# Patient Record
Sex: Male | Born: 1935 | Race: White | Hispanic: No | Smoking: Never smoker
Health system: Southern US, Community
[De-identification: ages and names within clinical notes are randomized; demographics above are authoritative.]

## PROBLEM LIST (undated history)

## (undated) DIAGNOSIS — I441 Atrioventricular block, second degree: Secondary | ICD-10-CM

## (undated) DIAGNOSIS — I10 Essential (primary) hypertension: Secondary | ICD-10-CM

## (undated) DIAGNOSIS — I350 Nonrheumatic aortic (valve) stenosis: Secondary | ICD-10-CM

## (undated) DIAGNOSIS — K222 Esophageal obstruction: Secondary | ICD-10-CM

## (undated) HISTORY — DX: Nonrheumatic aortic (valve) stenosis: I35.0

## (undated) HISTORY — PX: APPENDECTOMY: SHX54

## (undated) HISTORY — PX: TONSILLECTOMY: SHX5217

## (undated) HISTORY — PX: PACEMAKER IMPLANT: EP1218

## (undated) HISTORY — DX: Esophageal obstruction: K22.2

## (undated) HISTORY — DX: Atrioventricular block, second degree: I44.1

---

## 2000-09-12 ENCOUNTER — Inpatient Hospital Stay (HOSPITAL_COMMUNITY): Admission: EM | Admit: 2000-09-12 | Discharge: 2000-09-17 | Payer: Self-pay | Admitting: Emergency Medicine

## 2000-09-12 ENCOUNTER — Encounter: Payer: Self-pay | Admitting: Emergency Medicine

## 2000-09-14 ENCOUNTER — Encounter: Payer: Self-pay | Admitting: Internal Medicine

## 2000-09-15 ENCOUNTER — Encounter: Payer: Self-pay | Admitting: Internal Medicine

## 2000-11-26 ENCOUNTER — Ambulatory Visit (HOSPITAL_COMMUNITY): Admission: RE | Admit: 2000-11-26 | Discharge: 2000-11-26 | Payer: Self-pay | Admitting: Internal Medicine

## 2001-01-06 ENCOUNTER — Ambulatory Visit (HOSPITAL_COMMUNITY): Admission: RE | Admit: 2001-01-06 | Discharge: 2001-01-06 | Payer: Self-pay | Admitting: Internal Medicine

## 2001-01-06 ENCOUNTER — Encounter (INDEPENDENT_AMBULATORY_CARE_PROVIDER_SITE_OTHER): Payer: Self-pay | Admitting: Internal Medicine

## 2001-01-20 ENCOUNTER — Ambulatory Visit (HOSPITAL_COMMUNITY): Admission: RE | Admit: 2001-01-20 | Discharge: 2001-01-20 | Payer: Self-pay | Admitting: Internal Medicine

## 2001-01-20 ENCOUNTER — Encounter (INDEPENDENT_AMBULATORY_CARE_PROVIDER_SITE_OTHER): Payer: Self-pay | Admitting: Internal Medicine

## 2001-04-22 ENCOUNTER — Ambulatory Visit (HOSPITAL_COMMUNITY): Admission: RE | Admit: 2001-04-22 | Discharge: 2001-04-22 | Payer: Self-pay | Admitting: Internal Medicine

## 2001-04-22 ENCOUNTER — Encounter (INDEPENDENT_AMBULATORY_CARE_PROVIDER_SITE_OTHER): Payer: Self-pay | Admitting: Internal Medicine

## 2001-05-03 ENCOUNTER — Observation Stay (HOSPITAL_COMMUNITY): Admission: RE | Admit: 2001-05-03 | Discharge: 2001-05-04 | Payer: Self-pay | Admitting: Internal Medicine

## 2001-05-03 ENCOUNTER — Encounter (INDEPENDENT_AMBULATORY_CARE_PROVIDER_SITE_OTHER): Payer: Self-pay | Admitting: Internal Medicine

## 2004-04-04 ENCOUNTER — Ambulatory Visit (HOSPITAL_COMMUNITY): Admission: RE | Admit: 2004-04-04 | Discharge: 2004-04-04 | Payer: Self-pay | Admitting: General Surgery

## 2005-09-21 ENCOUNTER — Emergency Department (HOSPITAL_COMMUNITY): Admission: EM | Admit: 2005-09-21 | Discharge: 2005-09-21 | Payer: Self-pay | Admitting: Emergency Medicine

## 2006-08-24 ENCOUNTER — Ambulatory Visit (HOSPITAL_COMMUNITY): Admission: RE | Admit: 2006-08-24 | Discharge: 2006-08-24 | Payer: Self-pay | Admitting: Family Medicine

## 2010-08-29 NOTE — H&P (Signed)
Danny [name], BAYS NO.:  [id number]   MEDICAL RECORD NO.:  [id number]           PATIENT TYPE:   LOCATION:                                 FACILITY:   PHYSICIAN:  Dalia Heading, M.D.  DATE OF BIRTH:  [birth year]   DATE OF ADMISSION:  DATE OF DISCHARGE:  LH                                HISTORY & PHYSICAL   CHIEF COMPLAINT:  Anemia, Hemoccult positive stools.   HISTORY OF PRESENT ILLNESS:  The patient is a 76 year old white male who is  referred for endoscopic evaluation.  He needs a colonoscopy and EGD for  anemia and Hemoccult positive stools.  He denies any abdominal pain, weight  loss, nausea, vomiting, diarrhea, constipation, melena.  He had a  colonoscopy many years ago.  There is no family history of colon carcinoma.  There is no history of hemorrhoidal disease.   PAST MEDICAL HISTORY:  Includes hypertension.   PAST SURGICAL HISTORY:  Intestinal mesenteric arterial stent placement in  2002.   CURRENT MEDICATIONS:  Zestril.   ALLERGIES:  No known drug allergies.   REVIEW OF SYSTEMS:  Noncontributory.   PHYSICAL EXAMINATION:  GENERAL:  The patient is a well-developed, well-  nourished white male in no acute distress.  VITAL SIGNS:  He is afebrile and vital signs are stable.  LUNGS:  Clear to auscultation with equal breath sounds bilaterally.  HEART:  Regular rate and rhythm without S3, S4, murmurs.  ABDOMEN:  Soft, nontender, nondistended.  No hepatosplenomegaly or masses  are noted.  RECTAL:  Deferred to the procedure.   IMPRESSION:  Anemia, Hemoccult positive stools.   PLAN:  The patient was scheduled for an EGD and colonoscopy on April 04, 2004.  The risks and benefits of the procedure, including bleeding and  perforation, were fully explained to the patient; gave informed consent.     Mark   MAJ/MEDQ  D:  04/01/2004  T:  04/01/2004  Job:  [id number]   cc:   Dalia Heading, M.D.  [address], [place]  [place]  [address]  Fax: [phone]   Bernerd Limbo. Leona Carry, M.D.  [address]  [place]  [place] [address]  Fax: [phone]

## 2014-12-10 ENCOUNTER — Encounter (HOSPITAL_COMMUNITY): Payer: Self-pay | Admitting: Cardiology

## 2014-12-10 ENCOUNTER — Emergency Department (HOSPITAL_COMMUNITY): Payer: Medicare Other

## 2014-12-10 ENCOUNTER — Inpatient Hospital Stay (HOSPITAL_COMMUNITY)
Admission: EM | Admit: 2014-12-10 | Discharge: 2014-12-12 | DRG: 244 | Disposition: A | Discharge status: Home / Self Care | Payer: Medicare Other | Attending: Cardiology | Admitting: Cardiology

## 2014-12-10 DIAGNOSIS — R739 Hyperglycemia, unspecified: Secondary | ICD-10-CM

## 2014-12-10 DIAGNOSIS — I442 Atrioventricular block, complete: Secondary | ICD-10-CM | POA: Diagnosis not present

## 2014-12-10 DIAGNOSIS — Z959 Presence of cardiac and vascular implant and graft, unspecified: Secondary | ICD-10-CM

## 2014-12-10 DIAGNOSIS — I4510 Unspecified right bundle-branch block: Secondary | ICD-10-CM

## 2014-12-10 DIAGNOSIS — N289 Disorder of kidney and ureter, unspecified: Secondary | ICD-10-CM | POA: Diagnosis not present

## 2014-12-10 DIAGNOSIS — N19 Unspecified kidney failure: Secondary | ICD-10-CM

## 2014-12-10 DIAGNOSIS — I350 Nonrheumatic aortic (valve) stenosis: Secondary | ICD-10-CM | POA: Diagnosis present

## 2014-12-10 DIAGNOSIS — I441 Atrioventricular block, second degree: Secondary | ICD-10-CM | POA: Diagnosis present

## 2014-12-10 DIAGNOSIS — R001 Bradycardia, unspecified: Principal | ICD-10-CM

## 2014-12-10 DIAGNOSIS — I10 Essential (primary) hypertension: Secondary | ICD-10-CM | POA: Diagnosis not present

## 2014-12-10 DIAGNOSIS — Z66 Do not resuscitate: Secondary | ICD-10-CM | POA: Diagnosis not present

## 2014-12-10 HISTORY — DX: Essential (primary) hypertension: I10

## 2014-12-10 LAB — CBC WITH DIFFERENTIAL/PLATELET
BASOS PCT: 0 % (ref 0–1)
Basophils Absolute: 0 10*3/uL (ref 0.0–0.1)
EOS ABS: 0.2 10*3/uL (ref 0.0–0.7)
EOS PCT: 3 % (ref 0–5)
HCT: 37.2 % — ABNORMAL LOW (ref 39.0–52.0)
HEMOGLOBIN: 12.3 g/dL — AB (ref 13.0–17.0)
LYMPHS ABS: 1.3 10*3/uL (ref 0.7–4.0)
Lymphocytes Relative: 19 % (ref 12–46)
MCH: 30.5 pg (ref 26.0–34.0)
MCHC: 33.1 g/dL (ref 30.0–36.0)
MCV: 92.3 fL (ref 78.0–100.0)
MONO ABS: 0.8 10*3/uL (ref 0.1–1.0)
MONOS PCT: 12 % (ref 3–12)
NEUTROS PCT: 66 % (ref 43–77)
Neutro Abs: 4.5 10*3/uL (ref 1.7–7.7)
PLATELETS: 164 10*3/uL (ref 150–400)
RBC: 4.03 MIL/uL — ABNORMAL LOW (ref 4.22–5.81)
RDW: 16.8 % — AB (ref 11.5–15.5)
WBC: 6.8 10*3/uL (ref 4.0–10.5)

## 2014-12-10 LAB — COMPREHENSIVE METABOLIC PANEL
ALBUMIN: 3.8 g/dL (ref 3.5–5.0)
ALT: 52 U/L (ref 17–63)
ANION GAP: 7 (ref 5–15)
AST: 59 U/L — ABNORMAL HIGH (ref 15–41)
Alkaline Phosphatase: 64 U/L (ref 38–126)
BUN: 46 mg/dL — ABNORMAL HIGH (ref 6–20)
CALCIUM: 8.9 mg/dL (ref 8.9–10.3)
CO2: 22 mmol/L (ref 22–32)
Chloride: 102 mmol/L (ref 101–111)
Creatinine, Ser: 1.86 mg/dL — ABNORMAL HIGH (ref 0.61–1.24)
GFR calc non Af Amer: 30 mL/min — ABNORMAL LOW (ref >60)
GFR, EST AFRICAN AMERICAN: 35 mL/min — AB (ref >60)
GLUCOSE: 183 mg/dL — AB (ref 65–99)
POTASSIUM: 5 mmol/L (ref 3.5–5.1)
SODIUM: 131 mmol/L — AB (ref 135–145)
Total Bilirubin: 0.8 mg/dL (ref 0.3–1.2)
Total Protein: 7.7 g/dL (ref 6.5–8.1)

## 2014-12-10 LAB — URINALYSIS, ROUTINE W REFLEX MICROSCOPIC
BILIRUBIN URINE: NEGATIVE
Glucose, UA: NEGATIVE mg/dL
HGB URINE DIPSTICK: NEGATIVE
Ketones, ur: NEGATIVE mg/dL
Leukocytes, UA: NEGATIVE
NITRITE: NEGATIVE
PROTEIN: NEGATIVE mg/dL
SPECIFIC GRAVITY, URINE: 1.02 (ref 1.005–1.030)
UROBILINOGEN UA: 0.2 mg/dL (ref 0.0–1.0)
pH: 6 (ref 5.0–8.0)

## 2014-12-10 LAB — MAGNESIUM: Magnesium: 2.5 mg/dL — ABNORMAL HIGH (ref 1.7–2.4)

## 2014-12-10 LAB — PROTIME-INR
INR: 1.17 (ref 0.00–1.49)
PROTHROMBIN TIME: 15.1 s (ref 11.6–15.2)

## 2014-12-10 LAB — TROPONIN I
Troponin I: <0.03 ng/mL (ref <0.031)
Troponin I: 0.03 ng/mL (ref <0.031)

## 2014-12-10 LAB — I-STAT CHEM 8, ED
BUN: 42 mg/dL — AB (ref 6–20)
CALCIUM ION: 1.16 mmol/L (ref 1.13–1.30)
CHLORIDE: 102 mmol/L (ref 101–111)
Creatinine, Ser: 1.7 mg/dL — ABNORMAL HIGH (ref 0.61–1.24)
Glucose, Bld: 182 mg/dL — ABNORMAL HIGH (ref 65–99)
HEMATOCRIT: 42 % (ref 39.0–52.0)
Hemoglobin: 14.3 g/dL (ref 13.0–17.0)
Potassium: 5.1 mmol/L (ref 3.5–5.1)
SODIUM: 134 mmol/L — AB (ref 135–145)
TCO2: 21 mmol/L (ref 0–100)

## 2014-12-10 LAB — TSH: TSH: 1.541 u[IU]/mL (ref 0.350–4.500)

## 2014-12-10 LAB — BRAIN NATRIURETIC PEPTIDE: B NATRIURETIC PEPTIDE 5: 1215 pg/mL — AB (ref 0.0–100.0)

## 2014-12-10 LAB — MRSA PCR SCREENING: MRSA BY PCR: NEGATIVE

## 2014-12-10 LAB — T4, FREE: FREE T4: 1.3 ng/dL — AB (ref 0.61–1.12)

## 2014-12-10 MED ORDER — ACETAMINOPHEN 325 MG PO TABS: 650.0000 mg | ORAL_TABLET | ORAL | Status: DC | PRN

## 2014-12-10 MED ORDER — SODIUM CHLORIDE 0.9 % IV SOLN
INTRAVENOUS | Status: DC
  Administered 2014-12-10: 22:00:00 via INTRAVENOUS

## 2014-12-10 MED ORDER — ATROPINE SULFATE 0.1 MG/ML IJ SOLN
0.5000 mg | Freq: Once | INTRAMUSCULAR | Status: AC
  Administered 2014-12-10: 0.5 mg via INTRAVENOUS
  Filled 2014-12-10: qty 10

## 2014-12-10 MED ORDER — ONDANSETRON HCL 4 MG/2ML IJ SOLN
4.0000 mg | Freq: Four times a day (QID) | INTRAMUSCULAR | Status: DC | PRN
  Administered 2014-12-11 (×2): 4 mg via INTRAVENOUS
  Filled 2014-12-10 (×2): qty 2

## 2014-12-10 MED ORDER — CITALOPRAM HYDROBROMIDE 10 MG PO TABS
10.0000 mg | ORAL_TABLET | Freq: Every day | ORAL | Status: DC
  Administered 2014-12-11 – 2014-12-12 (×2): 10 mg via ORAL
  Filled 2014-12-10 (×2): qty 1

## 2014-12-10 NOTE — ED Notes (Signed)
Sob, weakness, poor appetite

## 2014-12-10 NOTE — ED Provider Notes (Addendum)
CSN: [id number]     Arrival date & time 12/10/14  1648 History   First MD Initiated Contact with Patient 12/10/14 1701     Chief Complaint  Patient presents with  . Shortness of Breath  . Weakness     (Consider location/radiation/quality/duration/timing/severity/associated sxs/prior Treatment) HPI Comments: Patient brought to the emergency department for evaluation of generalized weakness and shortness of breath. Family reports that the patient has had a downward decline over the last couple of months since his wife died. Today he has been complaining of severe weakness and shortness of breath with movement and ambulation, feels improved at rest. He has had some minor sinus congestion, no cough or fever. Patient denies chest pain.  Patient is a 81 y.o. male presenting with shortness of breath and weakness.  Shortness of Breath Weakness Associated symptoms include shortness of breath.    Past Medical History  Diagnosis Date  . Hypertension    History reviewed. No pertinent past surgical history. History reviewed. No pertinent family history. Social History  Substance Use Topics  . Smoking status: Never Smoker   . Smokeless tobacco: None  . Alcohol Use: No    Review of Systems  Respiratory: Positive for shortness of breath.   Neurological: Positive for weakness.  All other systems reviewed and are negative.     Allergies  Review of patient's allergies indicates no known allergies.  Home Medications   Prior to Admission medications   Medication Sig Start Date End Date Taking? Authorizing Provider  citalopram (CELEXA) 10 MG tablet Take 10 mg by mouth daily.   Yes Historical Provider, MD  LORazepam (ATIVAN) 1 MG tablet Take 1 mg by mouth at bedtime as needed for anxiety or sleep.   Yes Historical Provider, MD  lisinopril-hydrochlorothiazide (PRINZIDE,ZESTORETIC) 20-12.5 MG per tablet Take 1 tablet by mouth daily. 09/28/14   Historical Provider, MD   BP 147/100 mmHg   Pulse 61  Temp(Src) 97.6 F (36.4 C) (Oral)  Resp 18  Ht 5\' 7"  (1.702 m)  Wt 170 lb (77.111 kg)  BMI 26.62 kg/m2  SpO2 98% Physical Exam  Constitutional: He is oriented to person, place, and time. He appears well-developed and well-nourished. No distress.  HENT:  Head: Normocephalic and atraumatic.  Right Ear: Hearing normal.  Left Ear: Hearing normal.  Nose: Nose normal.  Mouth/Throat: Oropharynx is clear and moist and mucous membranes are normal.  Eyes: Conjunctivae and EOM are normal. Pupils are equal, round, and reactive to light.  Neck: Normal range of motion. Neck supple.  Cardiovascular: Regular rhythm, S1 normal and S2 normal.  Bradycardia present.  Exam reveals no gallop and no friction rub.   No murmur heard. Pulmonary/Chest: Effort normal and breath sounds normal. No respiratory distress. He exhibits no tenderness.  Abdominal: Soft. Normal appearance and bowel sounds are normal. There is no hepatosplenomegaly. There is no tenderness. There is no rebound, no guarding, no tenderness at McBurney's point and negative Murphy's sign. No hernia.  Musculoskeletal: Normal range of motion.  Neurological: He is alert and oriented to person, place, and time. He has normal strength. No cranial nerve deficit or sensory deficit. Coordination normal. GCS eye subscore is 4. GCS verbal subscore is 5. GCS motor subscore is 6.  Skin: Skin is warm, dry and intact. No rash noted. No cyanosis.  Psychiatric: He has a normal mood and affect. His speech is normal and behavior is normal. Thought content normal.  Nursing note and vitals reviewed.   ED Course  Procedures (  including critical care time) Labs Review Labs Reviewed  CBC WITH DIFFERENTIAL/PLATELET - Abnormal; Notable for the following:    RBC 4.03 (*)    Hemoglobin 12.3 (*)    HCT 37.2 (*)    RDW 16.8 (*)    All other components within normal limits  I-STAT CHEM 8, ED - Abnormal; Notable for the following:    Sodium 134 (*)    BUN  42 (*)    Creatinine, Ser 1.70 (*)    Glucose, Bld 182 (*)    All other components within normal limits  COMPREHENSIVE METABOLIC PANEL  TROPONIN I  URINALYSIS, ROUTINE W REFLEX MICROSCOPIC (NOT AT [facility])  BRAIN NATRIURETIC PEPTIDE    Imaging Review No results found. I have personally reviewed and evaluated these images and lab results as part of my medical decision-making.   EKG Interpretation   Date/Time:  Monday December 10 2014 17:02:58 EDT Ventricular Rate:  33 PR Interval:  188 QRS Duration: 141 QT Interval:  557 QTC Calculation: 413 R Axis:   135 Text Interpretation:  Junctional bradycardia Second degree heart block  with 2:1 A-V conduction Right bundle branch block No previous tracing  Reconfirmed by Pinkney Venard  MD, Malisa Ruggiero [phone]) on 12/10/2014 5:23:37 PM      MDM   Final diagnoses:  Second degree AV block    Patient presented to the ER for evaluation of generalized weakness. Patient was noted to have profound bradycardia at arrival. He reports mild shortness of breath, but symptoms are only present when he is walking and exerting himself. He has not had any chest pain. Patient is comfortable at rest despite his bradycardia. Patient's medications only include Prinzide, Celexa and Ativan. He is not on any calcium channel blocker or beta blocker. He does not have any known coronary artery disease.  EKG was evaluated and appears consistent with 2:1 AV block. He also has a right bundle branch block, no previous EKGs are available. Patient is not hypotensive. I did discuss the case with Dr. Delton See, on call for cardiology. She feels that this represents a high-grade block and the patient should be transferred to [facility] cardiac care unit for further evaluation, possible pacemaker. She did not recommend any initiation of treatment at this time, but if patient becomes symptomatic recommended transcutaneous pacing or Isuprel.    Gilda Crease, MD 12/10/14  1800  Gilda Crease, MD 12/10/14 [phone]

## 2014-12-10 NOTE — ED Notes (Signed)
MD at bedside. 

## 2014-12-10 NOTE — ED Notes (Signed)
Pt placed on zoll.  Hr 30's.  Dr Blinda Leatherwood notified.

## 2014-12-10 NOTE — H&P (Signed)
History and Physical  Patient ID: WALDON SHEERIN MRN: [id number], DOB: 05/10/1922 Date of Encounter: 12/10/2014, 7:56 PM Primary Physician: Isabella Stalling, MD Primary Cardiologist: New  Chief Complaint: SOB, weakness Reason for Admission: symptomatic bradycardia  HPI: Mr. Worley is an 81 y/o M with history of HTN who presented to [facility] today with symptomatic bradycardia. He had recently been having generalized weakness and exertional SOB x 3 days. His family reported he had had a downward decline ever since his wife died. At rest, he has been comfortable. Due to persistent symptoms he presented to the ER where he was found to have a HR in the 30s. No chest pain, cough, fever, bleeding or syncope. Labs notable for Hgb 12.3, Na 131, K 5.0, BUN/Cr 46/1.86, troponin neg x 1, BNP 1215. Glucose 180s. CXR with enlarged cardiac silhouette, otherwise no evidence of acute ardiopulmonary disease. He is not on any AVN blocking agents. He was given atropine without significant improvement in HR. BP stable in the 130s-140s systolic. He was transferred to [facility] for further management. No prior labs or EKG to compare to. He arrived to [facility] without complaint - says "I feel like I could get up and run."  Lost his wife 8 weeks ago - apparently she was in the same room (2H10) that he is in now. He says she had a long history of heart trouble back to 2004, and received a pacemaker herself 3 days before she passed.  Past Medical History  Diagnosis Date  . Hypertension      Surgical History: History reviewed. No pertinent past surgical history.   Home Meds: Prior to Admission medications   Medication Sig Start Date End Date Taking? Authorizing Provider  citalopram (CELEXA) 10 MG tablet Take 10 mg by mouth daily.   Yes Historical Provider, MD  LORazepam (ATIVAN) 1 MG tablet Take 1 mg by mouth at bedtime as needed for anxiety or sleep.   Yes Historical Provider, MD    lisinopril-hydrochlorothiazide (PRINZIDE,ZESTORETIC) 20-12.5 MG per tablet Take 1 tablet by mouth daily. 09/28/14   Historical Provider, MD    Allergies: No Known Allergies  Social History   Social History  . Marital Status: Single    Spouse Name: N/A  . Number of Children: N/A  . Years of Education: N/A   Occupational History  . Not on file.   Social History Main Topics  . Smoking status: Never Smoker   . Smokeless tobacco: Not on file  . Alcohol Use: No  . Drug Use: No  . Sexual Activity: Not on file   Other Topics Concern  . Not on file   Social History Narrative     Family History  Problem Relation Age of Onset  . CAD Neg Hx     Review of Systems: All other systems reviewed and are otherwise negative except as noted above.  Labs:   Lab Results  Component Value Date   WBC 6.8 12/10/2014   HGB 12.3* 12/10/2014   HCT 37.2* 12/10/2014   MCV 92.3 12/10/2014   PLT 164 12/10/2014     Recent Labs Lab 12/10/14 1724  NA 131*  K 5.0  CL 102  CO2 22  BUN 46*  CREATININE 1.86*  CALCIUM 8.9  PROT 7.7  BILITOT 0.8  ALKPHOS 64  ALT 52  AST 59*  GLUCOSE 183*    Recent Labs  12/10/14 1724  TROPONINI <0.03   No results found for: CHOL, HDL, LDLCALC, TRIG  No results found for: DDIMER  Radiology/Studies:  Dg Chest Port 1 View  12/10/2014   CLINICAL DATA:  Shortness of breath, weakness and poor appetite.  EXAM: PORTABLE CHEST - 1 VIEW  COMPARISON:  None.  FINDINGS: Shock pads overlie the thorax.  Cardiomediastinal silhouette is enlarged, which may be additionally exaggerated by portable technique. Mediastinal contours appear intact.  There is no evidence of focal airspace consolidation, pleural effusion or pneumothorax.  Osseous structures are without acute abnormality. Soft tissues are grossly normal.  IMPRESSION: Enlarged cardiac silhouette, otherwise no evidence of acute cardiopulmonary disease.   Electronically Signed   By: Ted Mcalpine M.D.   On:  12/10/2014 18:06   Wt Readings from Last 3 Encounters:  12/10/14 170 lb (77.111 kg)    EKG: complete heart block 33bpm, RBBB nonspecific ST-T changes  Physical Exam: Blood pressure 134/53, pulse 36, temperature 98 F (36.7 C), temperature source Oral, resp. rate 13, height  (1.702 m), weight 170 lb (77.111 kg), SpO2 97 %. General: Well developed, well nourished WM in no acute distress. Lying flat without issues Head: Normocephalic, atraumatic, sclera non-icteric, no xanthomas, nares are without discharge.  Neck: Negative for carotid bruits. JVD not elevated. Lungs: Clear bilaterally to auscultation without wheezes, rales, or rhonchi. Breathing is unlabored. Heart: Slow, regular with S1 S2. 2/6 SEM throughout precordium. No rubs or gallops. Abdomen: Soft, non-tender, non-distended with normoactive bowel sounds. No hepatomegaly. No rebound/guarding. No obvious abdominal masses. Msk:  Strength and tone appear normal for age. Extremities: No clubbing or cyanosis. No edema.  Distal pedal pulses are 2+ and equal bilaterally. Neuro: Alert and oriented X 3. No focal deficit. No facial asymmetry. Moves all extremities spontaneously. Psych:  Responds to questions appropriately with a normal affect.    ASSESSMENT AND PLAN:  1. High grade AV block - currently hemodynamically stable at rest. Will admit, place pacing pads on patient for use if needed, and keep NPO after midnight for probable pacemaker placement in AM. Check troponins and TSH. Have written a note for AM team to consult EP for PPM. If he becomes hemodynamically stable overnight may need to consider dopamine drip versus temp wire placement. Hold off pharmacologic DVT prophylaxis in case of urgent need for procedure. Check echo.  2. Renal insufficiency of unknown duration - hold lisinopril/HCTZ. Gently hydrate overnight.  3. Hyperglycemia - check A1C.   4. HTN - follow BP for now. No new antihypertensives until rhythm has resolved  in case he relies on residual BPs.  Code status: He has clarified he has a living will and wishes to remain a DNR. He gives his permission to treat him for symptomatic bradycardia but if his heart should stop completely, he does not want to be revived.  SignedRonie Spies PA-C 12/10/2014, 7:56 PM Pager: [phone] Patient seen and examined. I agree with the assessment and plan as detailed above. See also my additional thoughts below.   The patient has high degree AV block. He is stable with his escape rhythm. Blood pressure is quite stable. We will plan to watch him carefully during the night. Most probably he will need permanent pacing tomorrow. It is important to keep in mind some of the history points outlined above. The patient's wife died from long-standing heart disease 8 weeks ago. She died in room 2H10, the same room that he is currently in. In addition, she had had a pacemaker placed 3 days before her death. The patient seems quite viable. I suspect he  will do well with a pacemaker.  Willa Rough, MD, [facility] 12/10/2014 9:04 PM

## 2014-12-10 NOTE — Progress Notes (Signed)
eLink Physician-Brief Progress Note Patient Name: Danny [name] DOB: 06-13-1922 MRN: [id number]   Date of Service  12/10/2014  HPI/Events of Note  81 Y/O admitted with symptomatic bradycardia, high degree AV block. Hemodynamically stable, considering EP eval and pacemaker in AM.    eICU Interventions  No interventions.     Intervention Category Major Interventions: Acid-Base disturbance - evaluation and management;Other:  Danny [name] 12/10/2014, 9:10 PM

## 2014-12-11 ENCOUNTER — Encounter (HOSPITAL_COMMUNITY)
Admission: EM | Disposition: A | Discharge status: Home / Self Care | Payer: Medicare Other | Source: Home / Self Care | Attending: Cardiology

## 2014-12-11 ENCOUNTER — Inpatient Hospital Stay (HOSPITAL_COMMUNITY): Payer: Medicare Other

## 2014-12-11 DIAGNOSIS — I441 Atrioventricular block, second degree: Secondary | ICD-10-CM

## 2014-12-11 DIAGNOSIS — I350 Nonrheumatic aortic (valve) stenosis: Secondary | ICD-10-CM

## 2014-12-11 DIAGNOSIS — R001 Bradycardia, unspecified: Secondary | ICD-10-CM

## 2014-12-11 HISTORY — PX: EP IMPLANTABLE DEVICE: SHX172B

## 2014-12-11 LAB — CBC
HCT: 32.2 % — ABNORMAL LOW (ref 39.0–52.0)
HEMOGLOBIN: 10.6 g/dL — AB (ref 13.0–17.0)
MCH: 30.4 pg (ref 26.0–34.0)
MCHC: 32.9 g/dL (ref 30.0–36.0)
MCV: 92.3 fL (ref 78.0–100.0)
PLATELETS: 140 10*3/uL — AB (ref 150–400)
RBC: 3.49 MIL/uL — ABNORMAL LOW (ref 4.22–5.81)
RDW: 16.7 % — AB (ref 11.5–15.5)
WBC: 6.5 10*3/uL (ref 4.0–10.5)

## 2014-12-11 LAB — LIPID PANEL
Cholesterol: 135 mg/dL (ref 0–200)
HDL: 38 mg/dL — ABNORMAL LOW (ref >40)
LDL CALC: 87 mg/dL (ref 0–99)
Total CHOL/HDL Ratio: 3.6 RATIO
Triglycerides: 49 mg/dL (ref <150)
VLDL: 10 mg/dL (ref 0–40)

## 2014-12-11 LAB — BASIC METABOLIC PANEL
ANION GAP: 7 (ref 5–15)
BUN: 36 mg/dL — AB (ref 6–20)
CALCIUM: 8.7 mg/dL — AB (ref 8.9–10.3)
CO2: 24 mmol/L (ref 22–32)
CREATININE: 1.57 mg/dL — AB (ref 0.61–1.24)
Chloride: 104 mmol/L (ref 101–111)
GFR calc Af Amer: 43 mL/min — ABNORMAL LOW (ref >60)
GFR, EST NON AFRICAN AMERICAN: 37 mL/min — AB (ref >60)
GLUCOSE: 127 mg/dL — AB (ref 65–99)
Potassium: 5 mmol/L (ref 3.5–5.1)
Sodium: 135 mmol/L (ref 135–145)

## 2014-12-11 LAB — TROPONIN I: TROPONIN I: 0.03 ng/mL (ref <0.031)

## 2014-12-11 SURGERY — PACEMAKER IMPLANT

## 2014-12-11 MED ORDER — CHLORHEXIDINE GLUCONATE 4 % EX LIQD
60.0000 mL | Freq: Once | CUTANEOUS | Status: AC
  Administered 2014-12-11: 4 via TOPICAL
  Filled 2014-12-11: qty 30

## 2014-12-11 MED ORDER — CEFAZOLIN SODIUM-DEXTROSE 2-3 GM-% IV SOLR
INTRAVENOUS | Status: DC | PRN
  Administered 2014-12-11: 2 g via INTRAVENOUS

## 2014-12-11 MED ORDER — LIDOCAINE HCL (PF) 1 % IJ SOLN
INTRAMUSCULAR | Status: AC
  Filled 2014-12-11: qty 60

## 2014-12-11 MED ORDER — IOHEXOL 350 MG/ML SOLN
INTRAVENOUS | Status: DC | PRN
  Administered 2014-12-11: 10 mL via INTRAVENOUS

## 2014-12-11 MED ORDER — SODIUM CHLORIDE 0.9 % IJ SOLN
3.0000 mL | Freq: Two times a day (BID) | INTRAMUSCULAR | Status: DC
  Administered 2014-12-11 – 2014-12-12 (×2): 3 mL via INTRAVENOUS

## 2014-12-11 MED ORDER — HYDROCODONE-ACETAMINOPHEN 5-325 MG PO TABS: 1.0000 | ORAL_TABLET | ORAL | Status: DC | PRN

## 2014-12-11 MED ORDER — CEFAZOLIN SODIUM-DEXTROSE 2-3 GM-% IV SOLR
2.0000 g | INTRAVENOUS | Status: DC
  Filled 2014-12-11: qty 50

## 2014-12-11 MED ORDER — CHLORHEXIDINE GLUCONATE 4 % EX LIQD
60.0000 mL | Freq: Once | CUTANEOUS | Status: AC
  Administered 2014-12-11: 4 via TOPICAL

## 2014-12-11 MED ORDER — SODIUM CHLORIDE 0.9 % IV SOLN: 250.0000 mL | INTRAVENOUS | Status: DC | PRN

## 2014-12-11 MED ORDER — SODIUM CHLORIDE 0.9 % IV SOLN: INTRAVENOUS | Status: DC

## 2014-12-11 MED ORDER — LIDOCAINE HCL (PF) 1 % IJ SOLN
INTRAMUSCULAR | Status: DC | PRN
  Administered 2014-12-11: 50 mL

## 2014-12-11 MED ORDER — SODIUM CHLORIDE 0.9 % IJ SOLN: 3.0000 mL | INTRAMUSCULAR | Status: DC | PRN

## 2014-12-11 MED ORDER — HEPARIN (PORCINE) IN NACL 2-0.9 UNIT/ML-% IJ SOLN
INTRAMUSCULAR | Status: AC
  Filled 2014-12-11: qty 500

## 2014-12-11 MED ORDER — ACETAMINOPHEN 325 MG PO TABS: 325.0000 mg | ORAL_TABLET | ORAL | Status: DC | PRN

## 2014-12-11 MED ORDER — SODIUM CHLORIDE 0.9 % IR SOLN
80.0000 mg | Status: DC
  Filled 2014-12-11: qty 2

## 2014-12-11 MED ORDER — ONDANSETRON HCL 4 MG/2ML IJ SOLN: 4.0000 mg | Freq: Four times a day (QID) | INTRAMUSCULAR | Status: DC | PRN

## 2014-12-11 MED ORDER — CEFAZOLIN SODIUM 1-5 GM-% IV SOLN
1.0000 g | Freq: Four times a day (QID) | INTRAVENOUS | Status: AC
Start: 2014-12-11 — End: 2014-12-12
  Administered 2014-12-11 – 2014-12-12 (×3): 1 g via INTRAVENOUS
  Filled 2014-12-11 (×4): qty 50

## 2014-12-11 SURGICAL SUPPLY — 7 items
CABLE SURGICAL S-101-97-12 (CABLE) ×3 IMPLANT
LEAD CAPSURE NOVUS 5076-58CM (Lead) ×3 IMPLANT
LEAD TENDRIL SDX 1688TC-52CM (Lead) ×3 IMPLANT
PAD DEFIB LIFELINK (PAD) ×3 IMPLANT
PPM ASSURITY DR PM2240 (Pacemaker) ×3 IMPLANT
SHEATH CLASSIC 7F (SHEATH) ×6 IMPLANT
TRAY PACEMAKER INSERTION (CUSTOM PROCEDURE TRAY) ×3 IMPLANT

## 2014-12-11 NOTE — Care Management Note (Signed)
Case Management Note  Patient Details  Name: Danny [name] MRN: [id number] Date of Birth: June 09, 1922  Subjective/Objective:      Adm w brady, sec deg heart block              Action/Plan: lives at home   Expected Discharge Date:                  Expected Discharge Plan:  Home/Self Care  In-House Referral:     Discharge planning Services     Post Acute Care Choice:    Choice offered to:     DME Arranged:    DME Agency:     HH Arranged:    HH Agency:     Status of Service:     Medicare Important Message Given:    Date Medicare IM Given:    Medicare IM give by:    Date Additional Medicare IM Given:    Additional Medicare Important Message give by:     If discussed at Long Length of Stay Meetings, dates discussed:    Additional Comments: ur review done  Hanley Hays, RN 12/11/2014, 9:21 AM

## 2014-12-11 NOTE — Discharge Summary (Signed)
ELECTROPHYSIOLOGY PROCEDURE DISCHARGE SUMMARY    Patient ID: Danny [name],  MRN: [id number], DOB/AGE: 09/02/1922 81 y.o.  Admit date: 12/10/2014 Discharge date: 12/12/2014  Primary Care Physician: Isabella Stalling, MD Primary Cardiologist: Excell Seltzer (to establish) Electrophysiologist: Khyson Sebesta  Primary Discharge Diagnosis:  Symptomatic bradycardia status post pacemaker implantation this admission  Secondary Discharge Diagnosis:  1.  Severe aortic stenosis 2.  Hypertension  No Known Allergies   Procedures This Admission:  1.  Implantation of a STJ dual chamber PPM on 12/11/14 by Dr Johney Frame.  The patient received a STJ model number Assurity PPM with model number 1688 right atrial lead and 5076 right ventricular lead. There were no immediate post procedure complications. 2.  CXR on 12/12/14 demonstrated no pneumothorax status post device implantation 3.  Echo on 12/11/14 demonstrated normal EF and severe aortic stenosis   Brief HPI/Hospital Course:  Danny [name] is a 81 y.o. male with a past medical history significant for hypertension.  He presented to the hospital with a 3 day history of dyspnea on exertion and weakness.  He was found to be bradycardic with intermittent heart block. Echocardiogram demonstrated severe AS.  The patient wished to proceed with PPM implant and evaluate AS as an outpatient. The patient underwent implantation of a STJ dual chamber PPM with details as outlined above.  He was monitored on telemetry overnight which demonstrated atrial pacing with intrinsic ventricular conduction.  Left chest was without hematoma or ecchymosis.  The device was interrogated and found to be functioning normally.  CXR was obtained and demonstrated no pneumothorax status post device implantation.  Wound care, arm mobility, and restrictions were reviewed with the patient.  The patient was examined and considered stable for discharge to home.   Dr Excell Seltzer will see next week to  consult for aortic stenosis.    Physical Exam: Filed Vitals:   12/11/14 1810 12/11/14 1841 12/11/14 2100 12/12/14 0447  BP: 166/91 167/56 152/85 148/56  Pulse: 44 52 60 49  Temp:   98.1 F (36.7 C) 98.3 F (36.8 C)  TempSrc:   Oral Oral  Resp: Height:      Weight:    172 lb 9.9 oz (78.3 kg)  SpO2: 95% 95% 93% 99%    GEN- The patient is elderly appearing, alert and oriented x 3 today.   HEENT: normocephalic, atraumatic; sclera clear, conjunctiva pink; hearing intact; oropharynx clear; neck supple  Lungs- Clear to ausculation bilaterally, normal work of breathing.  No wheezes, rales, rhonchi Heart- Regular rate and rhythm, 3/6 systolic murmur GI- soft, non-tender, non-distended, bowel sounds present  Extremities- no clubbing, cyanosis, or edema; DP/PT/radial pulses 2+ bilaterally MS- no significant deformity or atrophy Skin- warm and dry, no rash or lesion, left chest without hematoma/ecchymosis Psych- euthymic mood, full affect Neuro- strength and sensation are intact   Labs:   Lab Results  Component Value Date   WBC 6.5 12/11/2014   HGB 10.6* 12/11/2014   HCT 32.2* 12/11/2014   MCV 92.3 12/11/2014   PLT 140* 12/11/2014     Recent Labs Lab 12/10/14 1724  12/12/14 0413  NA 131*  < > 134*  K 5.0  < > 4.4  CL 102  < > 105  CO2 22  < > 23  BUN 46*  < > 29*  CREATININE 1.86*  < > 1.47*  CALCIUM 8.9  < > 8.5*  PROT 7.7  --   --   BILITOT 0.8  --   --  ALKPHOS 64  --   --   ALT 52  --   --   AST 59*  --   --   GLUCOSE 183*  < > 113*  < > = values in this interval not displayed.  Discharge Medications:    Medication List    TAKE these medications        citalopram 10 MG tablet  Commonly known as:  CELEXA  Take 10 mg by mouth daily.     lisinopril-hydrochlorothiazide 20-12.5 MG per tablet  Commonly known as:  PRINZIDE,ZESTORETIC  Take 1 tablet by mouth daily.     LORazepam 1 MG tablet  Commonly known as:  ATIVAN  Take 1 mg by mouth at  bedtime as needed for anxiety or sleep.        Disposition:  Discharge Instructions    Diet - low sodium heart healthy    Complete by:  As directed      Increase activity slowly    Complete by:  As directed           Follow-up Information    Follow up with [facility] On 12/20/2014.   Why:  at 10AM for wound check   Contact information:   [address] [place] [address]       Follow up with Tonny Bollman, MD On 12/19/2014.   Specialty:  Cardiology   Why:  at [facility]   Contact information:   [address] [place] [address] [phone]       Duration of Discharge Encounter: Greater than 30 minutes including physician time.  Signed, Gypsy Balsam, NP 12/12/2014 7:30 AM   I have seen, examined the patient, and reviewed the above assessment and plan.  Changes to above are made where necessary.  Device interrogation reviewed and normal. CXR reveals stable leas, no ptx.  Co Sign: Hillis Range, MD 12/12/2014 12:08 PM

## 2014-12-11 NOTE — Progress Notes (Signed)
  Echocardiogram 2D Echocardiogram has been performed.  Arvil Chaco 12/11/2014, 10:37 AM

## 2014-12-11 NOTE — Interval H&P Note (Signed)
History and Physical Interval Note:  12/11/2014 9:57 AM  Danny [name]  has presented today for surgery, with the diagnosis of bradicardia  The various methods of treatment have been discussed with the patient and family. After consideration of risks, benefits and other options for treatment, the patient has consented to  Procedure(s): Pacemaker Implant (N/A) as a surgical intervention .  The patient's history has been reviewed, patient examined, no change in status, stable for surgery.  I have reviewed the patient's chart and labs.  Questions were answered to the patient's satisfaction.     Hillis Range

## 2014-12-11 NOTE — Consult Note (Addendum)
ELECTROPHYSIOLOGY CONSULT NOTE    Patient ID: Danny [name] MRN: [id number], DOB/AGE: 1922-11-12 81 y.o.  Admit date: 12/10/2014 Date of Consult: 12/11/2014  Primary Physician: Isabella Stalling, MD Primary Cardiologist: new to [facility]  Reason for Consultation: heart block  HPI:  Danny [name] is a 81 y.o. male with no significant past medical history aside from hypertension.  He remains very active at home and lives independently.  He developed acute onset shortness of breath and weakness 3 days prior to admission. His symptoms would improve with rest and worsen again with exertion. His wife passed away several weeks ago after 71 years of marriage and he is still grieving her loss.  His daughter took him to [facility] for evaluation where he was found to be bradycardic and he was transferred to [facility] for further evaluation.   This morning, he states that he feels well.  He has not been out of bed. He has no awareness of his bradycardia aside from symptoms of weakness and shortness of breath.  He is on no AVN blocking agents and TSH, electrolytes are WNL.  Echo is pending this admission.  EP has been asked to evaluate for treatment options.   Past Medical History  Diagnosis Date  . Hypertension      Surgical History: History reviewed. No pertinent past surgical history.   Prescriptions prior to admission  Medication Sig Dispense Refill Last Dose  . citalopram (CELEXA) 10 MG tablet Take 10 mg by mouth daily.   12/10/2014 at Unknown time  . LORazepam (ATIVAN) 1 MG tablet Take 1 mg by mouth at bedtime as needed for anxiety or sleep.   12/09/2014 at Unknown time  . lisinopril-hydrochlorothiazide (PRINZIDE,ZESTORETIC) 20-12.5 MG per tablet Take 1 tablet by mouth daily.       Inpatient Medications:  . citalopram  10 mg Oral Daily    Allergies: No Known Allergies  Social History   Social History  . Marital Status: Single    Spouse Name: N/A  . Number of Children: N/A   . Years of Education: N/A   Occupational History  . Not on file.   Social History Main Topics  . Smoking status: Never Smoker   . Smokeless tobacco: Not on file  . Alcohol Use: No  . Drug Use: No  . Sexual Activity: Not on file   Other Topics Concern  . Not on file   Social History Narrative     Family History  Problem Relation Age of Onset  . CAD Neg Hx      Review of Systems: General: No chills, fever, night sweats or weight changes  Cardiovascular:  Chronic mild LLE edema, No chest pain, dyspnea on exertion, orthopnea, palpitations, paroxysmal nocturnal dyspnea Dermatological: No rash, lesions or masses Respiratory: No cough, dyspnea Urologic: No hematuria, dysuria Abdominal: No nausea, vomiting, diarrhea, bright red blood per rectum, melena, or hematemesis Neurologic: No visual changes, weakness, changes in mental status All other systems reviewed and are otherwise negative except as noted above.  Physical Exam: Filed Vitals:   12/11/14 0400 12/11/14 0500 12/11/14 0600 12/11/14 0700  BP: 157/76 164/74 162/47 162/58  Pulse: 52 54 42 49  Temp: 98.2 F (36.8 C)     TempSrc: Oral     Resp: Height:      Weight: 173 lb 1 oz (78.5 kg)     SpO2: 94% 94% 94% 90%    GEN- The patient is elderly appearing,  alert and oriented x 3 today.   HEENT: normocephalic, atraumatic; sclera clear, conjunctiva pink; hearing intact; oropharynx clear; neck supple, no JVP Lymph- no cervical lymphadenopathy Lungs- Clear to ausculation bilaterally, normal work of breathing.  No wheezes, rales, rhonchi Heart- Bradycardic regular rate and rhythm, 2/6 SEM which is late peaking GI- soft, non-tender, non-distended, bowel sounds present, no hepatosplenomegaly Extremities- no clubbing, cyanosis, or edema; DP/PT/radial pulses 1+ bilaterally MS- no significant deformity or atrophy Skin- warm and dry, no rash or lesion Psych- euthymic mood, full affect Neuro- strength and sensation  are intact  Labs:   Lab Results  Component Value Date   WBC 6.8 12/10/2014   HGB 12.3* 12/10/2014   HCT 37.2* 12/10/2014   MCV 92.3 12/10/2014   PLT 164 12/10/2014    Recent Labs Lab 12/10/14 1724  NA 131*  K 5.0  CL 102  CO2 22  BUN 46*  CREATININE 1.86*  CALCIUM 8.9  PROT 7.7  BILITOT 0.8  ALKPHOS 64  ALT 52  AST 59*  GLUCOSE 183*      Radiology/Studies: Dg Chest Port 1 View 12/10/2014   CLINICAL DATA:  Shortness of breath, weakness and poor appetite.  EXAM: PORTABLE CHEST - 1 VIEW  COMPARISON:  None.  FINDINGS: Shock pads overlie the thorax.  Cardiomediastinal silhouette is enlarged, which may be additionally exaggerated by portable technique. Mediastinal contours appear intact.  There is no evidence of focal airspace consolidation, pleural effusion or pneumothorax.  Osseous structures are without acute abnormality. Soft tissues are grossly normal.  IMPRESSION: Enlarged cardiac silhouette, otherwise no evidence of acute cardiopulmonary disease.   Electronically Signed   By: Ted Mcalpine M.D.   On: 12/10/2014 18:06    EKG: 2:1 heart block, rate 33, RBBB  TELEMETRY: sinus rhythm with intermittent 2:1 heart block  Assessment/Plan: 1.  High grade heart block The patient has been found to have symptomatic high grade heart block with no reversible causes identified. Risks, benefits to pacemaker implantation were discussed with the patient by Dr Johney Frame who wishes to proceed. Will obtain echo this morning prior to device implantation.   2.  HTN BP elevated this morning Will resume anti-hypertensives after device implantation    Signed, Gypsy Balsam, NP 12/11/2014 7:44 AM  I have seen, examined the patient, and reviewed the above assessment and plan.  On exam, elderly, bradycardic regular rhythm. Changes to above are made where necessary.  The patient has symptomatic AV block.  No reversible causes are found.  I would therefore recommend pacemaker implantation at  this time.  Risks, benefits, alternatives to pacemaker implantation were discussed in detail with the patient today. The patient understands that the risks include but are not limited to bleeding, infection, pneumothorax, perforation, tamponade, vascular damage, renal failure, MI, stroke, death,  and lead dislodgement and wishes to proceed. We will therefore schedule the procedure at the next available time.   Co Sign: Hillis Range, MD 12/11/2014 9:55 AM  Addendum 2d echo reveals very severe Aortic stenosis.  Preserved EF.  Pt is very high risk for any EP procedures. Hillis Range MD, [facility] 12/11/2014 1:17 PM

## 2014-12-11 NOTE — H&P (View-Only) (Signed)
ELECTROPHYSIOLOGY CONSULT NOTE    Patient ID: Danny [name] MRN: [id number], DOB/AGE: 1922-12-02 81 y.o.  Admit date: 12/10/2014 Date of Consult: 12/11/2014  Primary Physician: Isabella Stalling, MD Primary Cardiologist: new to [facility]  Reason for Consultation: heart block  HPI:  Danny [name] is a 81 y.o. male with no significant past medical history aside from hypertension.  He remains very active at home and lives independently.  He developed acute onset shortness of breath and weakness 3 days prior to admission. His symptoms would improve with rest and worsen again with exertion. His wife passed away several weeks ago after 71 years of marriage and he is still grieving her loss.  His daughter took him to [facility] for evaluation where he was found to be bradycardic and he was transferred to [facility] for further evaluation.   This morning, he states that he feels well.  He has not been out of bed. He has no awareness of his bradycardia aside from symptoms of weakness and shortness of breath.  He is on no AVN blocking agents and TSH, electrolytes are WNL.  Echo is pending this admission.  EP has been asked to evaluate for treatment options.   Past Medical History  Diagnosis Date  . Hypertension      Surgical History: History reviewed. No pertinent past surgical history.   Prescriptions prior to admission  Medication Sig Dispense Refill Last Dose  . citalopram (CELEXA) 10 MG tablet Take 10 mg by mouth daily.   12/10/2014 at Unknown time  . LORazepam (ATIVAN) 1 MG tablet Take 1 mg by mouth at bedtime as needed for anxiety or sleep.   12/09/2014 at Unknown time  . lisinopril-hydrochlorothiazide (PRINZIDE,ZESTORETIC) 20-12.5 MG per tablet Take 1 tablet by mouth daily.       Inpatient Medications:  . citalopram  10 mg Oral Daily    Allergies: No Known Allergies  Social History   Social History  . Marital Status: Single    Spouse Name: N/A  . Number of Children: N/A   . Years of Education: N/A   Occupational History  . Not on file.   Social History Main Topics  . Smoking status: Never Smoker   . Smokeless tobacco: Not on file  . Alcohol Use: No  . Drug Use: No  . Sexual Activity: Not on file   Other Topics Concern  . Not on file   Social History Narrative     Family History  Problem Relation Age of Onset  . CAD Neg Hx      Review of Systems: General: No chills, fever, night sweats or weight changes  Cardiovascular:  Chronic mild LLE edema, No chest pain, dyspnea on exertion, orthopnea, palpitations, paroxysmal nocturnal dyspnea Dermatological: No rash, lesions or masses Respiratory: No cough, dyspnea Urologic: No hematuria, dysuria Abdominal: No nausea, vomiting, diarrhea, bright red blood per rectum, melena, or hematemesis Neurologic: No visual changes, weakness, changes in mental status All other systems reviewed and are otherwise negative except as noted above.  Physical Exam: Filed Vitals:   12/11/14 0400 12/11/14 0500 12/11/14 0600 12/11/14 0700  BP: 157/76 164/74 162/47 162/58  Pulse: 52 54 42 49  Temp: 98.2 F (36.8 C)     TempSrc: Oral     Resp: Height:      Weight: 173 lb 1 oz (78.5 kg)     SpO2: 94% 94% 94% 90%    GEN- The patient is elderly appearing,  alert and oriented x 3 today.   HEENT: normocephalic, atraumatic; sclera clear, conjunctiva pink; hearing intact; oropharynx clear; neck supple, no JVP Lymph- no cervical lymphadenopathy Lungs- Clear to ausculation bilaterally, normal work of breathing.  No wheezes, rales, rhonchi Heart- Bradycardic regular rate and rhythm, 2/6 SEM GI- soft, non-tender, non-distended, bowel sounds present, no hepatosplenomegaly Extremities- no clubbing, cyanosis, or edema; DP/PT/radial pulses 1+ bilaterally MS- no significant deformity or atrophy Skin- warm and dry, no rash or lesion Psych- euthymic mood, full affect Neuro- strength and sensation are intact  Labs:     Lab Results  Component Value Date   WBC 6.8 12/10/2014   HGB 12.3* 12/10/2014   HCT 37.2* 12/10/2014   MCV 92.3 12/10/2014   PLT 164 12/10/2014    Recent Labs Lab 12/10/14 1724  NA 131*  K 5.0  CL 102  CO2 22  BUN 46*  CREATININE 1.86*  CALCIUM 8.9  PROT 7.7  BILITOT 0.8  ALKPHOS 64  ALT 52  AST 59*  GLUCOSE 183*      Radiology/Studies: Dg Chest Port 1 View 12/10/2014   CLINICAL DATA:  Shortness of breath, weakness and poor appetite.  EXAM: PORTABLE CHEST - 1 VIEW  COMPARISON:  None.  FINDINGS: Shock pads overlie the thorax.  Cardiomediastinal silhouette is enlarged, which may be additionally exaggerated by portable technique. Mediastinal contours appear intact.  There is no evidence of focal airspace consolidation, pleural effusion or pneumothorax.  Osseous structures are without acute abnormality. Soft tissues are grossly normal.  IMPRESSION: Enlarged cardiac silhouette, otherwise no evidence of acute cardiopulmonary disease.   Electronically Signed   By: Ted Mcalpine M.D.   On: 12/10/2014 18:06    EKG: 2:1 heart block, rate 33, RBBB  TELEMETRY: sinus rhythm with intermittent 2:1 heart block  Assessment/Plan: 1.  High grade heart block The patient has been found to have symptomatic high grade heart block with no reversible causes identified. Risks, benefits to pacemaker implantation were discussed with the patient by Dr Johney Frame who wishes to proceed. Will obtain echo this morning prior to device implantation.   2.  HTN BP elevated this morning Will resume anti-hypertensives after device implantation    Signed, Gypsy Balsam, NP 12/11/2014 7:44 AM  I have seen, examined the patient, and reviewed the above assessment and plan.  On exam, elderly, bradycardic regular rhythm. Changes to above are made where necessary.  The patient has symptomatic AV block.  No reversible causes are found.  I would therefore recommend pacemaker implantation at this time.  Risks,  benefits, alternatives to pacemaker implantation were discussed in detail with the patient today. The patient understands that the risks include but are not limited to bleeding, infection, pneumothorax, perforation, tamponade, vascular damage, renal failure, MI, stroke, death,  and lead dislodgement and wishes to proceed. We will therefore schedule the procedure at the next available time.   Co Sign: Hillis Range, MD 12/11/2014 9:55 AM

## 2014-12-12 ENCOUNTER — Encounter (HOSPITAL_COMMUNITY): Payer: Self-pay | Admitting: Internal Medicine

## 2014-12-12 ENCOUNTER — Inpatient Hospital Stay (HOSPITAL_COMMUNITY): Payer: Medicare Other

## 2014-12-12 LAB — BASIC METABOLIC PANEL
Anion gap: 6 (ref 5–15)
BUN: 29 mg/dL — ABNORMAL HIGH (ref 6–20)
CALCIUM: 8.5 mg/dL — AB (ref 8.9–10.3)
CO2: 23 mmol/L (ref 22–32)
CREATININE: 1.47 mg/dL — AB (ref 0.61–1.24)
Chloride: 105 mmol/L (ref 101–111)
GFR calc Af Amer: 47 mL/min — ABNORMAL LOW (ref >60)
GFR calc non Af Amer: 40 mL/min — ABNORMAL LOW (ref >60)
GLUCOSE: 113 mg/dL — AB (ref 65–99)
Potassium: 4.4 mmol/L (ref 3.5–5.1)
Sodium: 134 mmol/L — ABNORMAL LOW (ref 135–145)

## 2014-12-12 LAB — HEMOGLOBIN A1C
HEMOGLOBIN A1C: 6.5 % — AB (ref 4.8–5.6)
MEAN PLASMA GLUCOSE: 140 mg/dL

## 2014-12-12 MED FILL — Gentamicin Sulfate Inj 40 MG/ML: INTRAMUSCULAR | Qty: 2 | Status: AC

## 2014-12-12 MED FILL — Sodium Chloride Irrigation Soln 0.9%: Qty: 500 | Status: AC

## 2014-12-12 MED FILL — Heparin Sodium (Porcine) 2 Unit/ML in Sodium Chloride 0.9%: INTRAMUSCULAR | Qty: 500 | Status: AC

## 2014-12-12 NOTE — Evaluation (Signed)
Physical Therapy Evaluation Patient Details Name: Danny [name] MRN: [id number] DOB: 06-Apr-1923 Today's Date: 12/12/2014   History of Present Illness  81 y.o. male admitted to [facility] on 12/10/14 with symptomatic bradycardia.  Pt is now s/p pacemaker on 12/11/14.  He reports his wife passed away 8 weeks ago.  Pt with significant PMHx of HTN.   Clinical Impression  Pt is independent with mobility and supervision for gait.  I believe once he gets home and back into his normal routine his gait deviations will improve.  I encouraged the pt and his family to make sure he slows down (his gait speed and turning speed) as these are two examples of when he demonstrates the most gait instability.  PT will sign off.  Pt is safe to return home with intermittent supervision.     Follow Up Recommendations No PT follow up    Equipment Recommendations  None recommended by PT    Recommendations for Other Services   NA    Precautions / Restrictions Precautions Precautions: Fall Precaution Comments: mildly unsteady on his feet, especially if he gets to moving quickly      Mobility  Bed Mobility Overal bed mobility: Independent             General bed mobility comments: HOB flat, no rails  Transfers Overall transfer level: Modified independent Equipment used: None             General transfer comment: uses hands for transitions (lightly)  Ambulation/Gait Ambulation/Gait assistance: Supervision Ambulation Distance (Feet): 300 Feet Assistive device: None Gait Pattern/deviations: Step-through pattern;Staggering left;Staggering right   Gait velocity interpretation: at or above normal speed for age/gender General Gait Details: pt with mildly staggering gait pattern, but he was not wearing his shoes (he wears shoes all the time when up) and he has not been walking in 2 days since admission.  Pt had more significant LOBs when moving quickly, turning, and with head turns during gait.  I  encouraged him and his family for him to slow down and take extra precaution.   Stairs Stairs: Yes Stairs assistance: Supervision Stair Management: One rail Right Number of Stairs: 2 (one step x 2) General stair comments: pt did well with railing assist.           Balance Overall balance assessment: Needs assistance Sitting-balance support: Feet supported;No upper extremity supported Sitting balance-Leahy Scale: Good     Standing balance support: Single extremity supported;No upper extremity supported Standing balance-Leahy Scale: Good                   Standardized Balance Assessment Standardized Balance Assessment : Dynamic Gait Index   Dynamic Gait Index Level Surface: Normal Change in Gait Speed: Normal Gait with Horizontal Head Turns: Mild Impairment Gait with Vertical Head Turns: Mild Impairment Gait and Pivot Turn: Mild Impairment Steps: Mild Impairment       Pertinent Vitals/Pain Pain Assessment: No/denies pain    Home Living Family/patient expects to be discharged to:: Private residence Living Arrangements: Alone Available Help at Discharge: Family;Available PRN/intermittently (son lives next door) Type of Home: House Home Access: Stairs to enter Entrance Stairs-Rails: None Secretary/administrator of Steps: 1 Home Layout: One level Home Equipment: None      Prior Function Level of Independence: Independent         Comments: still drives        Extremity/Trunk Assessment   Upper Extremity Assessment: Overall WFL for tasks assessed (pt reports RN has  reviewed pacemaker precautions)           Lower Extremity Assessment: Generalized weakness (mild weakness from 3 days in the bed)      Cervical / Trunk Assessment: Normal  Communication   Communication: HOH  Cognition Arousal/Alertness: Awake/alert Behavior During Therapy: WFL for tasks assessed/performed Overall Cognitive Status: Within Functional Limits for tasks assessed                       General Comments General comments (skin integrity, edema, etc.): VSS throughout gait on RA          Assessment/Plan    PT Assessment Patent does not need any further PT services  PT Diagnosis Generalized weakness         PT Goals (Current goals can be found in the Care Plan section) Acute Rehab PT Goals Patient Stated Goal: to go home right now     End of Session   Activity Tolerance: Patient tolerated treatment well Patient left: in chair;with call bell/phone within reach;with family/visitor present Nurse Communication: Mobility status         Time: 1610-9604 PT Time Calculation (min) (ACUTE ONLY): 15 min   Charges:   PT Evaluation $Initial PT Evaluation Tier I: 1 Procedure      Danny [name], PT, DPT [phone]   12/12/2014, 11:56 AM

## 2014-12-12 NOTE — Progress Notes (Signed)
Patient doesn't want to wait for PT eval.  To be discharged.  Gypsy Balsam NP page, awaiting call back.  Patient being discharged.

## 2014-12-12 NOTE — Discharge Instructions (Signed)
Supplemental Discharge Instructions for  Pacemaker/Defibrillator Patients  Activity No heavy lifting or vigorous activity with your left/right arm for 6 to 8 weeks.  Do not raise your left/right arm above your head for one week.  Gradually raise your affected arm as drawn below.           __      12/14/14                 12/15/14                          12/16/14                      12/17/14  NO DRIVING   WOUND CARE - Keep the wound area clean and dry.  Do not get this area wet for one week. No showers for one week; you may shower on  12/17/14   . - The tape/steri-strips on your wound will fall off; do not pull them off.  No bandage is needed on the site.  DO  NOT apply any creams, oils, or ointments to the wound area. - If you notice any drainage or discharge from the wound, any swelling or bruising at the site, or you develop a fever > 101? F after you are discharged home, call the office at once.  Special Instructions - You are still able to use cellular telephones; use the ear opposite the side where you have your pacemaker/defibrillator.  Avoid carrying your cellular phone near your device. - When traveling through airports, show security personnel your identification card to avoid being screened in the metal detectors.  Ask the security personnel to use the hand wand. - Avoid arc welding equipment, MRI testing (magnetic resonance imaging), TENS units (transcutaneous nerve stimulators).  Call the office for questions about other devices. - Avoid electrical appliances that are in poor condition or are not properly grounded. - Microwave ovens are safe to be near or to operate.  Cardiac Diet This diet can help prevent heart disease and stroke. Many factors influence your heart health, including eating and exercise habits. Coronary risk rises a lot with abnormal blood fat (lipid) levels. Cardiac meal planning includes limiting unhealthy fats, increasing healthy fats, and making other small  dietary changes. General guidelines are as follows:  Adjust calorie intake to reach and maintain desirable body weight.  Limit total fat intake to less than 30% of total calories. Saturated fat should be less than 7% of calories.  Saturated fats are found in animal products and in some vegetable products. Saturated vegetable fats are found in coconut oil, cocoa butter, palm oil, and palm kernel oil. Read labels carefully to avoid these products as much as possible. Use butter in moderation. Choose tub margarines and oils that have 2 grams of fat or less. Good cooking oils are canola and olive oils.  Practice low-fat cooking techniques. Do not fry food. Instead, broil, bake, boil, steam, grill, roast on a rack, stir-fry, or microwave it. Other fat reducing suggestions include:  Remove the skin from poultry.  Remove all visible fat from meats.  Skim the fat off stews, soups, and gravies before serving them.  Steam vegetables in water or broth instead of sauting them in fat.  Avoid foods with trans fat (or hydrogenated oils), such as commercially fried foods and commercially baked goods. Commercial shortening and deep-frying fats will contain trans fat.  Increase intake  of fruits, vegetables, whole grains, and legumes to replace foods high in fat.  Increase consumption of nuts, legumes, and seeds to at least 4 servings weekly. One serving of a legume equals  cup, and 1 serving of nuts or seeds equals  cup.  Choose whole grains more often. Have 3 servings per day (a serving is 1 ounce [oz]).  Eat 4 to 5 servings of vegetables per day. A serving of vegetables is 1 cup of raw leafy vegetables;  cup of raw or cooked cut-up vegetables;  cup of vegetable juice.  Eat 4 to 5 servings of fruit per day. A serving of fruit is 1 medium whole fruit;  cup of dried fruit;  cup of fresh, frozen, or canned fruit;  cup of 100% fruit juice.  Increase your intake of dietary fiber to 20 to 30 grams per  day. Insoluble fiber may help lower your risk of heart disease and may help curb your appetite.  Soluble fiber binds cholesterol to be removed from the blood. Foods high in soluble fiber are dried beans, citrus fruits, oats, apples, bananas, broccoli, Brussels sprouts, and eggplant.  Try to include foods fortified with plant sterols or stanols, such as yogurt, breads, juices, or margarines. Choose several fortified foods to achieve a daily intake of 2 to 3 grams of plant sterols or stanols.  Foods with omega-3 fats can help reduce your risk of heart disease. Aim to have a 3.5 oz portion of fatty fish twice per week, such as salmon, mackerel, albacore tuna, sardines, lake trout, or herring. If you wish to take a fish oil supplement, choose one that contains 1 gram of both DHA and EPA.  Limit processed meats to 2 servings (3 oz portion) weekly.  Limit the sodium in your diet to 1500 milligrams (mg) per day. If you have high blood pressure, talk to a registered dietitian about a DASH (Dietary Approaches to Stop Hypertension) eating plan.  Limit sweets and beverages with added sugar, such as soda, to no more than 5 servings per week. One serving is:   1 tablespoon sugar.  1 tablespoon jelly or jam.   cup sorbet.  1 cup lemonade.   cup regular soda. CHOOSING FOODS Starches  Allowed: Breads: All kinds (wheat, rye, raisin, white, oatmeal, [place], [place], and English muffin bread). Low-fat rolls: English muffins, frankfurter and hamburger buns, bagels, pita bread, tortillas (not fried). Pancakes, waffles, biscuits, and muffins made with recommended oil.  Avoid: Products made with saturated or trans fats, oils, or whole milk products. Butter rolls, cheese breads, croissants. Commercial doughnuts, muffins, sweet rolls, biscuits, waffles, pancakes, store-bought mixes. Crackers  Allowed: Low-fat crackers and snacks: Animal, graham, rye, saltine (with recommended oil, no lard), oyster, and matzo  crackers. Bread sticks, melba toast, rusks, flatbread, pretzels, and light popcorn.  Avoid: High-fat crackers: cheese crackers, butter crackers, and those made with coconut, palm oil, or trans fat (hydrogenated oils). Buttered popcorn. Cereals  Allowed: Hot or cold whole-grain cereals.  Avoid: Cereals containing coconut, hydrogenated vegetable fat, or animal fat. Potatoes / Pasta / Rice  Allowed: All kinds of potatoes, rice, and pasta (such as macaroni, spaghetti, and noodles).  Avoid: Pasta or rice prepared with cream sauce or high-fat cheese. Chow mein noodles, [place] fries. Vegetables  Allowed: All vegetables and vegetable juices.  Avoid: Fried vegetables. Vegetables in cream, butter, or high-fat cheese sauces. Limit coconut. Fruit in cream or custard. Protein  Allowed: Limit your intake of meat, seafood, and poultry to no more  than 6 oz (cooked weight) per day. All lean, well-trimmed beef, veal, pork, and lamb. All chicken and [place] without skin. All fish and shellfish. Wild game: wild duck, rabbit, pheasant, and venison. Egg whites or low-cholesterol egg substitutes may be used as desired. Meatless dishes: recipes with dried beans, peas, lentils, and tofu (soybean curd). Seeds and nuts: all seeds and most nuts.  Avoid: Prime grade and other heavily marbled and fatty meats, such as short ribs, spare ribs, rib eye roast or steak, frankfurters, sausage, bacon, and high-fat luncheon meats, mutton. Caviar. Commercially fried fish. Domestic duck, goose, venison sausage. Organ meats: liver, gizzard, heart, chitterlings, brains, kidney, sweetbreads. Dairy  Allowed: Low-fat cheeses: nonfat or low-fat cottage cheese (1% or 2% fat), cheeses made with part skim milk, such as mozzarella, farmers, string, or ricotta. (Cheeses should be labeled no more than 2 to 6 grams fat per oz.). Skim (or 1%) milk: liquid, powdered, or evaporated. Buttermilk made with low-fat milk. Drinks made with skim or  low-fat milk or cocoa. Chocolate milk or cocoa made with skim or low-fat (1%) milk. Nonfat or low-fat yogurt.  Avoid: Whole milk cheeses, including colby, cheddar, [place], [address], [place], [place], [place], [address], [place], and blue. Creamed cottage cheese, cream cheese. Whole milk and whole milk products, including buttermilk or yogurt made from whole milk, drinks made from whole milk. Condensed milk, evaporated whole milk, and 2% milk. Soups and Combination Foods  Allowed: Low-fat low-sodium soups: broth, dehydrated soups, homemade broth, soups with the fat removed, homemade cream soups made with skim or low-fat milk. Low-fat spaghetti, lasagna, chili, and Spanish rice if low-fat ingredients and low-fat cooking techniques are used.  Avoid: Cream soups made with whole milk, cream, or high-fat cheese. All other soups. Desserts and Sweets  Allowed: Sherbet, fruit ices, gelatins, meringues, and angel food cake. Homemade desserts with recommended fats, oils, and milk products. Jam, jelly, honey, marmalade, sugars, and syrups. Pure sugar candy, such as gum drops, hard candy, jelly beans, marshmallows, mints, and small amounts of dark chocolate.  Avoid: Commercially prepared cakes, pies, cookies, frosting, pudding, or mixes for these products. Desserts containing whole milk products, chocolate, coconut, lard, palm oil, or palm kernel oil. Ice cream or ice cream drinks. Candy that contains chocolate, coconut, butter, hydrogenated fat, or unknown ingredients. Buttered syrups. Fats and Oils  Allowed: Vegetable oils: safflower, sunflower, corn, soybean, cottonseed, sesame, canola, olive, or peanut. Non-hydrogenated margarines. Salad dressing or mayonnaise: homemade or commercial, made with a recommended oil. Low or nonfat salad dressing or mayonnaise.  Limit added fats and oils to 6 to 8 tsp per day (includes fats used in cooking, baking, salads, and spreads on bread). Remember to count the "hidden  fats" in foods.  Avoid: Solid fats and shortenings: butter, lard, salt pork, bacon drippings. Gravy containing meat fat, shortening, or suet. Cocoa butter, coconut. Coconut oil, palm oil, palm kernel oil, or hydrogenated oils: these ingredients are often used in bakery products, nondairy creamers, whipped toppings, candy, and commercially fried foods. Read labels carefully. Salad dressings made of unknown oils, sour cream, or cheese, such as blue cheese and Roquefort. Cream, all kinds: half-and-half, light, heavy, or whipping. Sour cream or cream cheese (even if "light" or low-fat). Nondairy cream substitutes: coffee creamers and sour cream substitutes made with palm, palm kernel, hydrogenated oils, or coconut oil. Beverages  Allowed: Coffee (regular or decaffeinated), tea. Diet carbonated beverages, mineral water. Alcohol: Check with your caregiver. Moderation is recommended.  Avoid: Whole milk, regular sodas, and  juice drinks with added sugar. Condiments  Allowed: All seasonings and condiments. Cocoa powder. "Cream" sauces made with recommended ingredients.  Avoid: Carob powder made with hydrogenated fats. SAMPLE MENU Breakfast   cup orange juice   cup oatmeal  1 slice toast  1 tsp margarine  1 cup skim milk Lunch  [place] sandwich with 2 oz [place], 2 slices bread  Lettuce and tomato slices  Fresh fruit  Carrot sticks  Coffee or tea Snack  Fresh fruit or low-fat crackers Dinner  3 oz lean ground beef  1 baked potato  1 tsp margarine   cup asparagus  Lettuce salad  1 tbs non-creamy dressing   cup peach slices  1 cup skim milk Document Released: 01/07/2008 Document Revised: 09/29/2011 Document Reviewed: 05/30/2013 [employer] Patient Information 2015 [place], [place]. This information is not intended to replace advice given to you by your health care provider. Make sure you discuss any questions you have with your health care provider.

## 2014-12-19 ENCOUNTER — Ambulatory Visit (INDEPENDENT_AMBULATORY_CARE_PROVIDER_SITE_OTHER): Payer: Medicare Other | Admitting: *Deleted

## 2014-12-19 ENCOUNTER — Encounter: Payer: Self-pay | Admitting: Cardiovascular Disease

## 2014-12-19 ENCOUNTER — Ambulatory Visit (INDEPENDENT_AMBULATORY_CARE_PROVIDER_SITE_OTHER): Payer: Medicare Other | Admitting: Cardiovascular Disease

## 2014-12-19 VITALS — BP 130/70 | HR 44 | Ht 67.0 in | Wt 168.0 lb

## 2014-12-19 DIAGNOSIS — I4510 Unspecified right bundle-branch block: Secondary | ICD-10-CM

## 2014-12-19 DIAGNOSIS — I350 Nonrheumatic aortic (valve) stenosis: Secondary | ICD-10-CM | POA: Diagnosis not present

## 2014-12-19 DIAGNOSIS — I442 Atrioventricular block, complete: Secondary | ICD-10-CM | POA: Diagnosis not present

## 2014-12-19 LAB — CUP PACEART INCLINIC DEVICE CHECK
Battery Voltage: 3.14 V
Brady Statistic RA Percent Paced: 15 %
Lead Channel Pacing Threshold Amplitude: 1 V
Lead Channel Pacing Threshold Amplitude: 1 V
Lead Channel Pacing Threshold Pulse Width: 0.5 ms
Lead Channel Pacing Threshold Pulse Width: 0.5 ms
Lead Channel Sensing Intrinsic Amplitude: 5 mV
Lead Channel Setting Pacing Amplitude: 3.5 V
Lead Channel Setting Sensing Sensitivity: 2 mV
MDC IDC MSMT BATTERY REMAINING LONGEVITY: 85.2 mo
MDC IDC MSMT LEADCHNL RA IMPEDANCE VALUE: 537.5 Ohm
MDC IDC MSMT LEADCHNL RA PACING THRESHOLD PULSEWIDTH: 0.5 ms
MDC IDC MSMT LEADCHNL RV IMPEDANCE VALUE: 612.5 Ohm
MDC IDC MSMT LEADCHNL RV PACING THRESHOLD AMPLITUDE: 1 V
MDC IDC MSMT LEADCHNL RV PACING THRESHOLD AMPLITUDE: 1 V
MDC IDC MSMT LEADCHNL RV PACING THRESHOLD PULSEWIDTH: 0.5 ms
MDC IDC MSMT LEADCHNL RV SENSING INTR AMPL: 12 mV
MDC IDC SESS DTM: 20160907162053
MDC IDC SET LEADCHNL RV PACING AMPLITUDE: 3.5 V
MDC IDC SET LEADCHNL RV PACING PULSEWIDTH: 0.5 ms
MDC IDC STAT BRADY RV PERCENT PACED: 92 %
Pulse Gen Serial Number: 7810686

## 2014-12-19 NOTE — Progress Notes (Signed)
Cardiology Office Note Date:  12/19/2014   ID:  Danny [name], DOB 11-Aug-1922, MRN [id number]  PCP:  Isabella Stalling, MD  Cardiologist:  Tonny Bollman, MD    Chief Complaint  Patient presents with  . New Patient (Initial Visit)     History of Present Illness: Danny [name] is a 81 y.o. male who presents for  Evaluation of severe aortic stenosis. The patient was recently hospitalized with symptomatic bradycardia /high-grade heart block and he required permanent pacemaker implantation.  His wife of many years has recently passed away within the last 3 months and he's had a very difficult time since her death.   He has known of a heart murmur ever since he was in [employer] as a young man. Prior to his recent hospital admission, he had never had cardiac problems. He reports good control of his blood pressure and heart rate over the years. He developed fairly sudden onset of shortness of breath, weakness, and lightheadedness recently which led to his hospital admission and pacemaker placement. Since discharge from the hospital, he has felt much better. Walked '5 laps' this morning which consisted of > 500 feet per his report and had no symptoms with that.  He specifically denies chest pain, chest pressure, lightheadedness, presyncope, edema, orthopnea, or PND. He does admit to mild shortness of breath with prolonged activity.  He has no other complaints at this time.   Past Medical History  Diagnosis Date  . Hypertension     Past Surgical History  Procedure Laterality Date  . Ep implantable device N/A 12/11/2014    Procedure: Pacemaker Implant;  Surgeon: Hillis Range, MD;  Location: [facility] INVASIVE CV LAB;  Service: Cardiovascular;  Laterality: N/A;    Current Outpatient Prescriptions  Medication Sig Dispense Refill  . citalopram (CELEXA) 10 MG tablet Take 10 mg by mouth daily.    Marland Kitchen lisinopril-hydrochlorothiazide (PRINZIDE,ZESTORETIC) 20-12.5 MG per tablet Take 1 tablet by mouth  daily.    Marland Kitchen LORazepam (ATIVAN) 1 MG tablet Take 1 mg by mouth at bedtime as needed for anxiety or sleep.     No current facility-administered medications for this visit.    Allergies:   Review of patient's allergies indicates no known allergies.   Social History:  The patient  reports that he has never smoked. He does not have any smokeless tobacco history on file. He reports that he does not drink alcohol or use illicit drugs.   Family History:  The patient's family history includes Heart disease in his brother, father, and sister; Hypertension in his mother; Other in his sister and sister. There is no history of CAD.   ROS:  Please see the history of present illness.  Otherwise, review of systems is positive for  Fatigue, easy bruising, decreased appetite.  All other systems are reviewed and negative.   PHYSICAL EXAM: VS:  BP 130/70 mmHg  Pulse 44  Ht 5\' 7"  (1.702 m)  Wt 168 lb (76.204 kg)  BMI 26.31 kg/m2 , BMI Body mass index is 26.31 kg/(m^2). GEN: Well nourished, well developed, pleasant elderly male in no acute distress HEENT: normal Neck: no JVD, no masses.  Carotid upstrokes are delayed Cardiac:  Regular rate and rhythm with grade 3/6 harsh crescendo decrescendo murmur best heard at the right upper sternal border           Respiratory:  clear to auscultation bilaterally, normal work of breathing GI: soft, nontender, nondistended, + BS MS: no deformity or atrophy Ext:  no pretibial edema, pedal pulses 2+= bilaterally Skin: warm and dry, no rash Neuro:  Strength and sensation are intact Psych: euthymic mood, full affect  EKG:  EKG is not ordered today.  Recent Labs: 12/10/2014: ALT 52; B Natriuretic Peptide 1215.0*; Magnesium 2.5*; TSH 1.541 12/11/2014: Hemoglobin 10.6*; Platelets 140* 12/12/2014: BUN 29*; Creatinine, Ser 1.47*; Potassium 4.4; Sodium 134*   Lipid Panel     Component Value Date/Time   CHOL 135 12/11/2014 0220   TRIG 49 12/11/2014 0220   HDL 38*  12/11/2014 0220   CHOLHDL 3.6 12/11/2014 0220   VLDL 10 12/11/2014 0220   LDLCALC 87 12/11/2014 0220      Wt Readings from Last 3 Encounters:  12/19/14 168 lb (76.204 kg)  12/12/14 172 lb 9.9 oz (78.3 kg)    Cardiac Studies Reviewed: 2D Echo: Left ventricle: The cavity size was mildly dilated. Systolic function was normal. The estimated ejection fraction was 55%. Images were inadequate for LV wall motion assessment. Features are consistent with a pseudonormal left ventricular filling pattern, with concomitant abnormal relaxation and increased filling pressure (grade 2 diastolic dysfunction). Doppler parameters are consistent with high ventricular filling pressure.  ------------------------------------------------------------------- Aortic valve:  Trileaflet. Severe diffuse thickening and calcification. Mobility was not restricted. Doppler:  There was severe stenosis.  There was mild regurgitation.  VTI ratio of LVOT to aortic valve: 0.19. Valve area (VTI): 0.65 cm^2. Indexed valve area (VTI): 0.33 cm^2/m^2. Peak velocity ratio of LVOT to aortic valve: 0.17. Valve area (Vmax): 0.59 cm^2. Indexed valve area (Vmax): 0.3 cm^2/m^2. Mean velocity ratio of LVOT to aortic valve: 0.19. Valve area (Vmean): 0.65 cm^2. Indexed valve area (Vmean): 0.33 cm^2/m^2.  Mean gradient (S): 40 mm Hg. Peak gradient (S): 80 mm Hg.  ------------------------------------------------------------------- Aorta: Aortic root: The aortic root was normal in size.  ------------------------------------------------------------------- Mitral valve:  Calcified annulus. Mild diffuse thickening and calcification. Mobility was not restricted. Doppler: Transvalvular velocity was within the normal range. There was no evidence for stenosis. There was mild regurgitation.  ------------------------------------------------------------------- Left atrium: The atrium was severely  dilated.  ------------------------------------------------------------------- Right ventricle: The cavity size was normal. Wall thickness was normal. Systolic function was normal.  ------------------------------------------------------------------- Pulmonic valve:  Structurally normal valve.  Cusp separation was normal. Doppler: Transvalvular velocity was within the normal range. There was no evidence for stenosis. There was no regurgitation.  ------------------------------------------------------------------- Tricuspid valve:  Structurally normal valve.  Doppler: Transvalvular velocity was within the normal range. There was mild regurgitation.  ------------------------------------------------------------------- Pulmonary artery:  The main pulmonary artery was normal-sized. Systolic pressure was within the normal range.  ------------------------------------------------------------------- Right atrium: The atrium was normal in size.  ------------------------------------------------------------------- Pericardium: There was no pericardial effusion.  ------------------------------------------------------------------- Systemic veins: Inferior vena cava: The vessel was normal in size.  ------------------------------------------------------------------- Measurements  Left ventricle              Value     Reference LV ID, ED, PLAX chordal      (H)   59  mm    43 - 52 LV ID, ES, PLAX chordal      (H)   42  mm    23 - 38 LV fx shortening, PLAX chordal      29  %    >=29 LV PW thickness, ED            10  mm    --------- IVS/LV PW ratio, ED            1.1      <=  1.3 Stroke volume, 2D             72  ml    --------- Stroke volume/bsa, 2D           37  ml/m^2  --------- LV ejection fraction, 1-p A4C       59  %    --------- LV  end-diastolic volume, 2-p       186  ml    --------- LV end-systolic volume, 2-p        82  ml    --------- LV ejection fraction, 2-p         56  %    --------- Stroke volume, 2-p            104  ml    --------- LV end-diastolic volume/bsa, 2-p     96  ml/m^2  --------- LV end-systolic volume/bsa, 2-p      42  ml/m^2  --------- Stroke volume/bsa, 2-p          53.5 ml/m^2  --------- LV e&', lateral              7.83 cm/s   --------- LV e&', medial               4.79 cm/s   --------- LV e&', average              6.31 cm/s   ---------  Ventricular septum            Value     Reference IVS thickness, ED             11  mm    ---------  LVOT                   Value     Reference LVOT ID, S                21  mm    --------- LVOT area                 3.46 cm^2   --------- LVOT peak velocity, S           76.1 cm/s   --------- LVOT mean velocity, S           54.6 cm/s   --------- LVOT VTI, S                20.9 cm    --------- LVOT peak gradient, S           2   mm Hg  ---------  Aortic valve               Value     Reference Aortic valve peak velocity, S       446  cm/s   --------- Aortic valve mean velocity, S       291  cm/s   --------- Aortic valve VTI, S            112  cm    --------- Aortic mean gradient, S          40  mm Hg  --------- Aortic peak gradient, S          80  mm Hg  --------- VTI ratio, LVOT/AV            0.19      --------- Aortic valve area, VTI          0.65 cm^2   --------- Aortic valve area/bsa,  VTI        0.33 cm^2/m^2 --------- Velocity  ratio, peak, LVOT/AV       0.17      --------- Aortic valve area, peak velocity     0.59 cm^2   --------- Aortic valve area/bsa, peak        0.3  cm^2/m^2 --------- velocity Velocity ratio, mean, LVOT/AV       0.19      --------- Aortic valve area, mean velocity     0.65 cm^2   --------- Aortic valve area/bsa, mean        0.33 cm^2/m^2 --------- velocity  Aorta                   Value     Reference Aortic root ID, ED            33  mm    ---------  Left atrium                Value     Reference LA ID, A-P, ES              37  mm    --------- LA ID/bsa, A-P              1.91 cm/m^2  <=2.2 LA volume, S               127  ml    --------- LA volume/bsa, S             65.4 ml/m^2  --------- LA volume, ES, 1-p A4C          102  ml    --------- LA volume/bsa, ES, 1-p A4C        52.5 ml/m^2  --------- LA volume, ES, 1-p A2C          148  ml    --------- LA volume/bsa, ES, 1-p A2C        76.2 ml/m^2  ---------  Mitral valve               Value     Reference Mitral deceleration time     (H)   332  ms    150 - 230 Mitral E/A ratio, peak          1.6      ---------  Right ventricle              Value     Reference TAPSE                   25.3 mm    --------- RV s&', lateral, S             10.7 cm/s   ---------  ASSESSMENT AND PLAN: This is an 81 year old gentleman with severe aortic stenosis. He is here with his children today to discuss treatment options. I have reviewed the natural history of aortic stenosis with the patient and his family members who are present today. We have discussed the limitations of medical therapy and the poor  prognosis associated with symptomatic aortic stenosis. We have also reviewed potential treatment options, including palliative medical therapy, conventional surgical aortic valve replacement, and transcatheter aortic valve replacement. We discussed treatment options in the context of this patient's specific comorbid medical conditions.   I have personally reviewed his recent echocardiogram which confirms severe the aortic stenosis based on severe thickening and restriction of all 3 aortic valve leaflets as well as hemodynamic criteria.  From a  symptomatic perspective, the patient is markedly improved after implantation of a permanent pacemaker. His symptoms came on fairly abruptly and I suspect they were related to the acute onset of heart block. He now is back to his normal functional capacity without significant cardiopulmonary limitation. He specifically denies any symptoms of angina or lightheadedness. He has mild shortness of breath but no symptoms with his normal activities including walking at a slow pace for exercise.  Considering the patient's lack of symptoms , recent pacemaker implantation, and recent loss of his wife of many years, I'm inclined to follow him with close observation at this point. The patient and his family also prefer this approach. I would like to see him back in 6 months with an echocardiogram prior to that office visit. We spent extensive time discussing the symptoms it can be associated with aortic stenosis and he will contact us if any of these symptoms arise. Otherwise I will plan on seeing him back in approximately 6 months. He understands to avoid heavy physical exertion or extreme temperatures. He will continue on his current medical program and no changes were made today.   Current medicines are reviewed with the patient today.  The patient does not have concerns regarding medicines.  Labs/ tests ordered today include:  No orders of the defined types were placed in  this encounter.    Disposition:   FU 6 months with an echocardiogram prior to that visit.   Enzo Bi, MD  12/19/2014 1:41 PM    [facility] [address] [place], [place], [place]  [address] Phone: [phone]; Fax: [phone]

## 2014-12-19 NOTE — Progress Notes (Signed)
Wound check appointment. Steri-strips removed. Wound without redness or edema. Incision edges approximated, wound well healed. Normal device function. Thresholds, sensing, and impedances consistent with implant measurements. Device programmed at 3.5V for extra safety margin until 3 month visit. Histogram distribution appropriate for patient and level of activity. 9 mode switches- longest 2:38, pk A 157, pk V 75. No high ventricular rates noted. Patient educated about wound care, arm mobility, lifting restrictions. ROV with JA 03/13/15.

## 2014-12-19 NOTE — Patient Instructions (Signed)
Medication Instructions:  Your physician recommends that you continue on your current medications as directed. Please refer to the Current Medication list given to you today.   Labwork: none  Testing/Procedures: Your physician has requested that you have an echocardiogram. Echocardiography is a painless test that uses sound waves to create images of your heart. It provides your doctor with information about the size and shape of your heart and how well your heart's chambers and valves are working. This procedure takes approximately one hour. There are no restrictions for this procedure.  To be done in 6 months--week or so prior to appointment with Dr. Excell Seltzer    Follow-Up: Your physician wants you to follow-up in: 6 months.  You will receive a reminder letter in the mail two months in advance. If you don't receive a letter, please call our office to schedule the follow-up appointment.   Any Other Special Instructions Will Be Listed Below (If Applicable).

## 2014-12-20 ENCOUNTER — Ambulatory Visit: Payer: Medicare Other

## 2015-01-04 ENCOUNTER — Encounter: Payer: Self-pay | Admitting: Internal Medicine

## 2015-03-13 ENCOUNTER — Encounter: Payer: Self-pay | Admitting: Internal Medicine

## 2015-03-13 ENCOUNTER — Ambulatory Visit (INDEPENDENT_AMBULATORY_CARE_PROVIDER_SITE_OTHER): Payer: Medicare Other | Admitting: Internal Medicine

## 2015-03-13 VITALS — BP 148/82 | HR 75 | Ht 67.0 in | Wt 166.4 lb

## 2015-03-13 DIAGNOSIS — R001 Bradycardia, unspecified: Secondary | ICD-10-CM | POA: Diagnosis not present

## 2015-03-13 DIAGNOSIS — I10 Essential (primary) hypertension: Secondary | ICD-10-CM

## 2015-03-13 DIAGNOSIS — I442 Atrioventricular block, complete: Secondary | ICD-10-CM

## 2015-03-13 DIAGNOSIS — I441 Atrioventricular block, second degree: Secondary | ICD-10-CM | POA: Diagnosis not present

## 2015-03-13 LAB — CUP PACEART INCLINIC DEVICE CHECK
Battery Voltage: 3.05 V
Implantable Lead Implant Date: 20160830
Implantable Lead Location: 753860
Implantable Lead Model: 5076
Lead Channel Impedance Value: 487.5 Ohm
Lead Channel Impedance Value: 562.5 Ohm
Lead Channel Pacing Threshold Amplitude: 1 V
Lead Channel Pacing Threshold Pulse Width: 0.5 ms
Lead Channel Sensing Intrinsic Amplitude: 12 mV
Lead Channel Setting Pacing Amplitude: 2 V
Lead Channel Setting Pacing Pulse Width: 0.5 ms
Lead Channel Setting Sensing Sensitivity: 2 mV
MDC IDC LEAD IMPLANT DT: 20160830
MDC IDC LEAD LOCATION: 753859
MDC IDC MSMT BATTERY REMAINING LONGEVITY: 135.6
MDC IDC MSMT LEADCHNL RA PACING THRESHOLD AMPLITUDE: 1 V
MDC IDC MSMT LEADCHNL RA PACING THRESHOLD PULSEWIDTH: 0.5 ms
MDC IDC MSMT LEADCHNL RA SENSING INTR AMPL: 3.1 mV
MDC IDC SESS DTM: 20161130134431
MDC IDC SET LEADCHNL RV PACING AMPLITUDE: 2.5 V
MDC IDC STAT BRADY RA PERCENT PACED: 12 %
MDC IDC STAT BRADY RV PERCENT PACED: 30 %
Pulse Gen Serial Number: 7810686

## 2015-03-13 NOTE — Progress Notes (Signed)
PCP: Danny StallingNDIEGO,Danny [name], Danny [name] Primary Cardiologist:   Danny [name] Primary EP:  Danny [name]  Danny [name] is a 81 y.o. male who presents today for routine electrophysiology followup.  Since his recent pacemaker implant, the patient reports doing very well.  He remains very active for his age.  He has been raking leaves and doing yard work without limitation.  Today, he denies symptoms of palpitations, chest pain, shortness of breath,  lower extremity edema, dizziness, presyncope, or syncope.  The patient is otherwise without complaint today.   Past Medical History  Diagnosis Date  . Hypertension   . Esophageal stricture     dilatation remotely no recurrent problems  . Second degree Mobitz II AV block   . Severe aortic stenosis    Past Surgical History  Procedure Laterality Date  . Ep implantable device N/A 12/11/2014    SJM Assurity Danny implanted by Danny [name] for mobitz II second degree AV block  . Tonsillectomy      remote    ROS- all systems are reviewed and negative except as per HPI above  Current Outpatient Prescriptions  Medication Sig Dispense Refill  . citalopram (CELEXA) 10 MG tablet Take 10 mg by mouth daily.    Marland Kitchen. lisinopril-hydrochlorothiazide (PRINZIDE,ZESTORETIC) 20-12.5 MG per tablet Take 1 tablet by mouth daily.    Marland Kitchen. LORazepam (ATIVAN) 1 MG tablet Take 1 mg by mouth at bedtime as needed for anxiety or sleep.     No current facility-administered medications for this visit.    Physical Exam: Filed Vitals:   03/13/15 0935  BP: 148/82  Pulse: 75  Height: 5\' 7"  (1.702 [name])  Weight: 166 lb 6.4 oz (75.479 kg)    GEN- The patient is well appearing, alert and oriented x 3 today.   Head- normocephalic, atraumatic Eyes-  Sclera clear, conjunctiva pink Ears- hearing intact Oropharynx- clear Lungs- Clear to ausculation bilaterally, normal work of breathing Chest- pacemaker pocket is well healed Heart- Regular rate and rhythm, 3/6 SEM LUSB (late peaking though A2 is audible  today) GI- soft, NT, ND, + BS Extremities- no clubbing, cyanosis, or edema  Pacemaker interrogation- reviewed in detail today,  See PACEART report  Assessment and Plan:  1. Mobitz II second degree AV block Normal pacemaker function See Pace Art report No changes today  2. HTN Reports good BP control at home  3. Aortic stenosis Followed by Danny [name] No urgent indication for procedures Ultimately, a conservative approach may be best  Merlin Return to see me in 1 year  Danny [name] Danny [name], [facility] - [facility]more[facility]ACC 03/13/2015 10:00 AM

## 2015-03-13 NOTE — Patient Instructions (Signed)
Medication Instructions:  Your physician recommends that you continue on your current medications as directed. Please refer to the Current Medication list given to you today.   Labwork: None ordered   Testing/Procedures: None ordered   Follow-Up: Your physician wants you to follow-up in: 12 months with Dr Johney FrameAllred Bonita QuinYou will receive a reminder letter in the mail two months in advance. If you don't receive a letter, please call our office to schedule the follow-up appointment.   Remote monitoring is used to monitor your Pacemaker  from home. This monitoring reduces the number of office visits required to check your device to one time per year. It allows us to keep an eye on the functioning of your device to ensure it is working properly. You are scheduled for a device check from home on 06/12/15. You may send your transmission at any time that day. If you have a wireless device, the transmission will be sent automatically. After your physician reviews your transmission, you will receive a postcard with your next transmission date.    Any Other Special Instructions Will Be Listed Below (If Applicable).     If you need a refill on your cardiac medications before your next appointment, please call your pharmacy.

## 2015-06-12 ENCOUNTER — Telehealth: Payer: Self-pay | Admitting: Cardiology

## 2015-06-12 ENCOUNTER — Ambulatory Visit (INDEPENDENT_AMBULATORY_CARE_PROVIDER_SITE_OTHER): Payer: Medicare Other | Admitting: *Deleted

## 2015-06-12 DIAGNOSIS — I442 Atrioventricular block, complete: Secondary | ICD-10-CM | POA: Diagnosis not present

## 2015-06-12 NOTE — Telephone Encounter (Signed)
LMOVM reminding pt to send remote transmission.   

## 2015-06-14 ENCOUNTER — Encounter: Payer: Self-pay | Admitting: Cardiology

## 2015-06-14 NOTE — Progress Notes (Signed)
Remote pacemaker transmission.   

## 2015-06-19 ENCOUNTER — Telehealth: Payer: Self-pay | Admitting: Internal Medicine

## 2015-06-19 NOTE — Telephone Encounter (Signed)
Spoke w/ and informed him that he can disregard that letter b/c his remote transmission was received on 06-14-15. Pt verbalized understanding.

## 2015-06-19 NOTE — Telephone Encounter (Signed)
New message      Pt received a letter stating we did not get his remote transmission.  He needs someone to help him send a transmission.

## 2015-06-21 ENCOUNTER — Ambulatory Visit (INDEPENDENT_AMBULATORY_CARE_PROVIDER_SITE_OTHER): Payer: Medicare Other | Admitting: Cardiovascular Disease

## 2015-06-21 ENCOUNTER — Ambulatory Visit (HOSPITAL_COMMUNITY): Payer: Medicare Other | Attending: Cardiology

## 2015-06-21 ENCOUNTER — Other Ambulatory Visit: Payer: Self-pay

## 2015-06-21 ENCOUNTER — Encounter: Payer: Self-pay | Admitting: Cardiovascular Disease

## 2015-06-21 VITALS — BP 148/80 | HR 64 | Ht 67.0 in | Wt 166.0 lb

## 2015-06-21 DIAGNOSIS — I77810 Thoracic aortic ectasia: Secondary | ICD-10-CM | POA: Diagnosis not present

## 2015-06-21 DIAGNOSIS — I340 Nonrheumatic mitral (valve) insufficiency: Secondary | ICD-10-CM | POA: Diagnosis not present

## 2015-06-21 DIAGNOSIS — I272 Other secondary pulmonary hypertension: Secondary | ICD-10-CM | POA: Insufficient documentation

## 2015-06-21 DIAGNOSIS — I352 Nonrheumatic aortic (valve) stenosis with insufficiency: Secondary | ICD-10-CM | POA: Diagnosis not present

## 2015-06-21 DIAGNOSIS — I350 Nonrheumatic aortic (valve) stenosis: Secondary | ICD-10-CM

## 2015-06-21 DIAGNOSIS — I359 Nonrheumatic aortic valve disorder, unspecified: Secondary | ICD-10-CM | POA: Diagnosis present

## 2015-06-21 DIAGNOSIS — I119 Hypertensive heart disease without heart failure: Secondary | ICD-10-CM | POA: Diagnosis not present

## 2015-06-21 NOTE — Patient Instructions (Addendum)
Medication Instructions:  Your physician recommends that you continue on your current medications as directed. Please refer to the Current Medication list given to you today.  Labwork: No new orders.   Testing/Procedures: No new orders.   Follow-Up: Your physician wants you to follow-up in: 6 MONTHS with Dr Excell Seltzerooper.  You will receive a reminder letter in the mail two months in advance. If you don't receive a letter, please call our office to schedule the follow-up appointment. (will plan to repeat Echo in 1 year)   Any Other Special Instructions Will Be Listed Below (If Applicable).     If you need a refill on your cardiac medications before your next appointment, please call your pharmacy.

## 2015-06-21 NOTE — Progress Notes (Signed)
Cardiology Office Note Date:  06/23/2015   ID:  Danny [name], DOB [birth year], MRN [id number]  PCP:  Isabella Stalling, MD  Cardiologist:  Tonny Bollman, MD    Chief Complaint  Patient presents with  . Follow-up    Essential Hypertension. Denies cp, sob, lee, or claudication     History of Present Illness: Danny [name] is a 82 y.o. male who presents for Follow-up of severe aortic stenosis. The patient was first seen in September 2016 after he was diagnosed with severe aortic stenosis. He had been hospitalized with symptomatic bradycardia and high-grade heart block and he underwent permanent pacemaker implantation. At the time of his initial evaluation, he and his family favored ongoing observation of aortic stenosis as he had recently lost his wife of many years and was minimally symptomatic after undergoing PPM placement.   The patient is here with his family today. He states he is doing everything he wants to do without any symptoms. Today, he denies symptoms of palpitations, chest pain, shortness of breath, orthopnea, PND, lower extremity edema, dizziness, or syncope.   Past Medical History  Diagnosis Date  . Hypertension   . Esophageal stricture     dilatation remotely no recurrent problems  . Second degree Mobitz II AV block   . Severe aortic stenosis     Past Surgical History  Procedure Laterality Date  . Ep implantable device N/A 12/11/2014    SJM Assurity DR implanted by Dr Johney Frame for mobitz II second degree AV block  . Tonsillectomy      remote    Current Outpatient Prescriptions  Medication Sig Dispense Refill  . lisinopril-hydrochlorothiazide (PRINZIDE,ZESTORETIC) 20-12.5 MG per tablet Take 1 tablet by mouth daily.     No current facility-administered medications for this visit.    Allergies:   Review of patient's allergies indicates no known allergies.   Social History:  The patient  reports that he has never smoked. He does not have any smokeless  tobacco history on file. He reports that he does not drink alcohol or use illicit drugs.   Family History:  The patient's  family history includes Heart disease in his brother, father, and sister; Hypertension in his mother; Other in his sister and sister. There is no history of CAD.    ROS:  Please see the history of present illness.  All other systems are reviewed and negative.    PHYSICAL EXAM: VS:  BP 148/80 mmHg  Pulse 64  Ht  (1.702 m)  Wt 166 lb (75.297 kg)  BMI 25.99 kg/m2 , BMI Body mass index is 25.99 kg/(m^2). GEN: Well nourished, well developed, pleasant elderly male in no acute distress HEENT: normal Neck: no JVD, no masses. No carotid bruits Cardiac: RRR with 3/6 systolic murmur at the RUSB, late peaking harsh murmur Respiratory:  clear to auscultation bilaterally, normal work of breathing GI: soft, nontender, nondistended, + BS MS: no deformity or atrophy Ext: no pretibial edema, pedal pulses 2+= bilaterally Skin: warm and dry, no rash Neuro:  Strength and sensation are intact Psych: euthymic mood, full affect  EKG:  EKG is not ordered today.  Recent Labs: 12/10/2014: ALT 52; B Natriuretic Peptide 1215.0*; Magnesium 2.5*; TSH 1.541 12/11/2014: Hemoglobin 10.6*; Platelets 140* 12/12/2014: BUN 29*; Creatinine, Ser 1.47*; Potassium 4.4; Sodium 134*   Lipid Panel     Component Value Date/Time   CHOL 135 12/11/2014 0220   TRIG 49 12/11/2014 0220   HDL 38* 12/11/2014 0220   CHOLHDL  3.6 12/11/2014 0220   VLDL 10 12/11/2014 0220   LDLCALC 87 12/11/2014 0220      Wt Readings from Last 3 Encounters:  06/21/15 166 lb (75.297 kg)  03/13/15 166 lb 6.4 oz (75.479 kg)  12/19/14 168 lb (76.204 kg)     Cardiac Studies Reviewed: 2D Echo (today's study): I have personally reviewed the patient's echo images. The formal interpretation is currently pending. The patient has severe calcification and restriction of all 3 aortic valve leaflets. Peak aortic valve velocity is  6 cm/s. The LVOT velocity is 69 cm/s. The dimension was index is 0.18. His peak and mean gradients are 61 and 35 mmHg, respectively. There is moderate aortic insufficiency and moderate mitral regurgitation.  ASSESSMENT AND PLAN: Severe Aortic Stenosis: the patient denies symptoms. After he leaves his family tells me he is in fact short of breath with activity but has decided he doesn't want to pursue treatment. I will see him back in 6 months for followup to see how he's doing. His prognosis is poor in the setting of symptomatic severe AS and progressive LV dysfunction.  Current medicines are reviewed with the patient today.  The patient does not have concerns regarding medicines.  Labs/ tests ordered today include:  No orders of the defined types were placed in this encounter.    Disposition:   FU 6 months  Signed, Tonny Bollmanooper, Zackarey Holleman, MD  06/23/2015 10:40 PM    [facility] [address], [place], [place]  [address] Phone: [phone](336) [phone]; Fax: [phone](336) [phone]

## 2015-09-12 ENCOUNTER — Telehealth: Payer: Self-pay | Admitting: Cardiology

## 2015-09-12 ENCOUNTER — Encounter: Payer: Medicare Other | Admitting: *Deleted

## 2015-09-12 NOTE — Telephone Encounter (Signed)
Spoke with pt and reminded pt of remote transmission that is due today. Pt verbalized understanding.   

## 2015-09-13 ENCOUNTER — Encounter: Payer: Self-pay | Admitting: Cardiology

## 2015-11-15 ENCOUNTER — Telehealth: Payer: Self-pay | Admitting: Cardiovascular Disease

## 2015-11-15 DIAGNOSIS — I350 Nonrheumatic aortic (valve) stenosis: Secondary | ICD-10-CM

## 2015-11-15 NOTE — Telephone Encounter (Signed)
Echo and office visit scheduled. Pt's daughter aware.

## 2015-11-15 NOTE — Telephone Encounter (Signed)
Agree with repeat echo. thanks

## 2015-11-15 NOTE — Telephone Encounter (Signed)
I spoke with the pt's daughter and arranged follow-up in October with Dr Excell Seltzer.  The pt's daughter would like the pt to have a repeat echocardiogram at this time.  She feels like the pt's symptoms have worsened and she would feel more comfortable if the pt had this test repeated.  I will forward this message to Dr Excell Seltzer to review and authorize echo order if needed.

## 2015-11-15 NOTE — Telephone Encounter (Signed)
New Message  Pt wife calling to schedule pt 34month f.u appt. But states she think the apt shouls be an ultrasound. No order in the system. Please call back to discuss

## 2016-01-29 ENCOUNTER — Encounter: Payer: Self-pay | Admitting: Cardiovascular Disease

## 2016-02-05 ENCOUNTER — Emergency Department (HOSPITAL_COMMUNITY): Payer: No Typology Code available for payment source

## 2016-02-05 ENCOUNTER — Other Ambulatory Visit: Payer: Self-pay

## 2016-02-05 ENCOUNTER — Emergency Department (HOSPITAL_COMMUNITY)
Admission: EM | Admit: 2016-02-05 | Discharge: 2016-02-05 | Disposition: A | Discharge status: Home / Self Care | Payer: No Typology Code available for payment source | Attending: Emergency Medicine | Admitting: Emergency Medicine

## 2016-02-05 ENCOUNTER — Encounter (HOSPITAL_COMMUNITY): Payer: Self-pay | Admitting: Emergency Medicine

## 2016-02-05 DIAGNOSIS — R410 Disorientation, unspecified: Secondary | ICD-10-CM | POA: Diagnosis not present

## 2016-02-05 DIAGNOSIS — R0789 Other chest pain: Secondary | ICD-10-CM

## 2016-02-05 DIAGNOSIS — Y92410 Unspecified street and highway as the place of occurrence of the external cause: Secondary | ICD-10-CM | POA: Diagnosis not present

## 2016-02-05 DIAGNOSIS — Y999 Unspecified external cause status: Secondary | ICD-10-CM | POA: Diagnosis not present

## 2016-02-05 DIAGNOSIS — R935 Abnormal findings on diagnostic imaging of other abdominal regions, including retroperitoneum: Secondary | ICD-10-CM | POA: Insufficient documentation

## 2016-02-05 DIAGNOSIS — I129 Hypertensive chronic kidney disease with stage 1 through stage 4 chronic kidney disease, or unspecified chronic kidney disease: Secondary | ICD-10-CM | POA: Insufficient documentation

## 2016-02-05 DIAGNOSIS — Y939 Activity, unspecified: Secondary | ICD-10-CM | POA: Insufficient documentation

## 2016-02-05 DIAGNOSIS — S299XXA Unspecified injury of thorax, initial encounter: Secondary | ICD-10-CM | POA: Diagnosis present

## 2016-02-05 DIAGNOSIS — N189 Chronic kidney disease, unspecified: Secondary | ICD-10-CM | POA: Insufficient documentation

## 2016-02-05 LAB — I-STAT TROPONIN, ED: Troponin i, poc: 0.02 ng/mL (ref 0.00–0.08)

## 2016-02-05 LAB — COMPREHENSIVE METABOLIC PANEL
ALBUMIN: 3.5 g/dL (ref 3.5–5.0)
ALK PHOS: 64 U/L (ref 38–126)
ALT: 17 U/L (ref 17–63)
AST: 30 U/L (ref 15–41)
Anion gap: 9 (ref 5–15)
BILIRUBIN TOTAL: 0.9 mg/dL (ref 0.3–1.2)
BUN: 36 mg/dL — AB (ref 6–20)
CALCIUM: 9.5 mg/dL (ref 8.9–10.3)
CO2: 21 mmol/L — ABNORMAL LOW (ref 22–32)
Chloride: 106 mmol/L (ref 101–111)
Creatinine, Ser: 1.67 mg/dL — ABNORMAL HIGH (ref 0.61–1.24)
GFR calc Af Amer: 40 mL/min — ABNORMAL LOW (ref >60)
GFR calc non Af Amer: 34 mL/min — ABNORMAL LOW (ref >60)
GLUCOSE: 112 mg/dL — AB (ref 65–99)
Potassium: 4.9 mmol/L (ref 3.5–5.1)
Sodium: 136 mmol/L (ref 135–145)
TOTAL PROTEIN: 8.2 g/dL — AB (ref 6.5–8.1)

## 2016-02-05 LAB — I-STAT CG4 LACTIC ACID, ED: LACTIC ACID, VENOUS: 2.33 mmol/L — AB (ref 0.5–1.9)

## 2016-02-05 LAB — CBC
HEMATOCRIT: 40.5 % (ref 39.0–52.0)
Hemoglobin: 13.7 g/dL (ref 13.0–17.0)
MCH: 30.9 pg (ref 26.0–34.0)
MCHC: 33.8 g/dL (ref 30.0–36.0)
MCV: 91.2 fL (ref 78.0–100.0)
Platelets: 157 10*3/uL (ref 150–400)
RBC: 4.44 MIL/uL (ref 4.22–5.81)
RDW: 16.7 % — AB (ref 11.5–15.5)
WBC: 7.3 10*3/uL (ref 4.0–10.5)

## 2016-02-05 LAB — I-STAT CHEM 8, ED
BUN: 41 mg/dL — AB (ref 6–20)
CHLORIDE: 106 mmol/L (ref 101–111)
Calcium, Ion: 1.02 mmol/L — ABNORMAL LOW (ref 1.15–1.40)
Creatinine, Ser: 1.7 mg/dL — ABNORMAL HIGH (ref 0.61–1.24)
Glucose, Bld: 102 mg/dL — ABNORMAL HIGH (ref 65–99)
HEMATOCRIT: 44 % (ref 39.0–52.0)
Hemoglobin: 15 g/dL (ref 13.0–17.0)
Potassium: 4.9 mmol/L (ref 3.5–5.1)
SODIUM: 139 mmol/L (ref 135–145)
TCO2: 21 mmol/L (ref 0–100)

## 2016-02-05 LAB — SAMPLE TO BLOOD BANK

## 2016-02-05 LAB — ETHANOL

## 2016-02-05 LAB — CDS SEROLOGY

## 2016-02-05 LAB — PROTIME-INR
INR: 1.12
Prothrombin Time: 14.5 seconds (ref 11.4–15.2)

## 2016-02-05 MED ORDER — IOPAMIDOL (ISOVUE-300) INJECTION 61%
INTRAVENOUS | Status: AC
  Administered 2016-02-05: 75 mL
  Filled 2016-02-05: qty 75

## 2016-02-05 NOTE — ED Notes (Signed)
Patient back from CT, patient on monitor

## 2016-02-05 NOTE — ED Notes (Signed)
Patient assisted to restroom with RN. Patient denies any dizziness, sob, or cp with ambulation.

## 2016-02-05 NOTE — ED Notes (Signed)
CT called and made aware that patient is ready

## 2016-02-05 NOTE — ED Notes (Signed)
Patient helped into scrubs for discharge home. Clothes thrown away per family.
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
## 2016-02-05 NOTE — ED Notes (Signed)
Pt only complaint is mid chest pain, non radiating with shortness of breath. Pt has cardiac hx.

## 2016-02-05 NOTE — ED Notes (Signed)
Patient to CT.
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
## 2016-02-06 ENCOUNTER — Encounter: Payer: Self-pay | Admitting: Cardiovascular Disease

## 2016-02-06 ENCOUNTER — Ambulatory Visit: Payer: Medicare Other | Admitting: Cardiovascular Disease

## 2016-02-06 ENCOUNTER — Other Ambulatory Visit (HOSPITAL_COMMUNITY): Payer: Medicare Other
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
## 2016-02-19 ENCOUNTER — Ambulatory Visit (INDEPENDENT_AMBULATORY_CARE_PROVIDER_SITE_OTHER): Payer: Medicare Other | Admitting: Internal Medicine

## 2016-02-19 ENCOUNTER — Encounter: Payer: Self-pay | Admitting: Internal Medicine

## 2016-02-19 VITALS — BP 173/83 | HR 76 | Ht 67.0 in | Wt 155.0 lb

## 2016-02-19 DIAGNOSIS — I442 Atrioventricular block, complete: Secondary | ICD-10-CM | POA: Diagnosis not present

## 2016-02-19 DIAGNOSIS — I10 Essential (primary) hypertension: Secondary | ICD-10-CM

## 2016-02-19 DIAGNOSIS — I350 Nonrheumatic aortic (valve) stenosis: Secondary | ICD-10-CM

## 2016-02-19 DIAGNOSIS — Z950 Presence of cardiac pacemaker: Secondary | ICD-10-CM

## 2016-02-19 NOTE — Progress Notes (Signed)
   PCP: Pcp Not In System Primary Cardiologist:   Dr Excell Seltzerooper Primary EP:  Dr Johney FrameAllred  Danny [name] is a 82 y.o. male who presents today for routine electrophysiology followup.  Since his last visit, the patient reports doing very well.  He remains very active for his age.  He has occasional SOB but is otherwise doing well.  Today, he denies symptoms of palpitations, chest pain, lower extremity edema, dizziness, presyncope, or syncope.  The patient is otherwise without complaint today.   Past Medical History:  Diagnosis Date  . Esophageal stricture    dilatation remotely no recurrent problems  . Hypertension   . Second degree Mobitz II AV block   . Severe aortic stenosis    Past Surgical History:  Procedure Laterality Date  . EP IMPLANTABLE DEVICE N/A 12/11/2014   SJM Assurity DR implanted by Dr Johney FrameAllred for mobitz II second degree AV block  . TONSILLECTOMY     remote    ROS- all systems are reviewed and negative except as per HPI above  Current Outpatient Prescriptions  Medication Sig Dispense Refill  . lisinopril-hydrochlorothiazide (PRINZIDE,ZESTORETIC) 20-12.5 MG tablet Take 1 tablet by mouth daily.     No current facility-administered medications for this visit.     Physical Exam: Vitals:   02/19/16 0934  BP: (!) 173/83  Pulse: 76  SpO2: 99%  Weight: 155 lb (70.3 kg)  Height: 5\' 7"  (1.702 m)    GEN- The patient is well appearing, alert and oriented x 3 today.   Head- normocephalic, atraumatic Eyes-  Sclera clear, conjunctiva pink Ears- hearing intact Oropharynx- clear Lungs- Clear to ausculation bilaterally, normal work of breathing Chest- pacemaker pocket is well healed Heart- Regular rate and rhythm, 3/6 SEM LUSB (late peaking though A2 is audible today) GI- soft, NT, ND, + BS Extremities- no clubbing, cyanosis, or edema  Pacemaker interrogation- reviewed in detail today,  See PACEART report  Assessment and Plan:  1. Mobitz II second degree AV  block Normal pacemaker function See Pace Art report No changes today I have discussed the available STJ firmware patch for cyberhacking with the patient today.  We have discussed the risks associated with the firmware upgrade as well as the theoretical risk for cyberhacking.  After an informed discussion, the patient has decided not to install the firmware patch at this time.   2. HTN Stable No change required today  3. Aortic stenosis Followed by Dr Excell Seltzerooper He wishes to avoid procedures Ultimately, a conservative approach may be best  Merlin Return to see me in 1 year  Hillis RangeJames Jerin Franzel MD, [facility] 02/19/2016 10:08 AM

## 2016-02-19 NOTE — Patient Instructions (Signed)
Medication Instructions:  Continue all current medications.  Labwork: none  Testing/Procedures: none  Follow-Up: Your physician wants you to follow up in:  1 year.  You will receive a reminder letter in the mail one-two months in advance.  If you don't receive a letter, please call our office to schedule the follow up appointment - Dr. Allred.   Any Other Special Instructions Will Be Listed Below (If Applicable). Remote monitoring is used to monitor your Pacemaker of ICD from home. This monitoring reduces the number of office visits required to check your device to one time per year. It allows us to keep an eye on the functioning of your device to ensure it is working properly. You are scheduled for a device check from home on 05/20/2016. You may send your transmission at any time that day. If you have a wireless device, the transmission will be sent automatically. After your physician reviews your transmission, you will receive a postcard with your next transmission date.  If you need a refill on your cardiac medications before your next appointment, please call your pharmacy.  

## 2016-02-21 ENCOUNTER — Encounter: Payer: Medicare Other | Admitting: Internal Medicine

## 2016-02-21 LAB — CUP PACEART INCLINIC DEVICE CHECK
Battery Remaining Longevity: 130 mo
Brady Statistic RV Percent Paced: 19 %
Implantable Lead Location: 753860
Implantable Lead Model: 5076
Lead Channel Pacing Threshold Amplitude: 1 V
Lead Channel Sensing Intrinsic Amplitude: 2.9 mV
Lead Channel Setting Pacing Amplitude: 2.5 V
Lead Channel Setting Pacing Pulse Width: 0.5 ms
MDC IDC LEAD IMPLANT DT: 20160830
MDC IDC LEAD IMPLANT DT: 20160830
MDC IDC LEAD LOCATION: 753859
MDC IDC MSMT BATTERY VOLTAGE: 3.04 V
MDC IDC MSMT LEADCHNL RA IMPEDANCE VALUE: 475 Ohm
MDC IDC MSMT LEADCHNL RA PACING THRESHOLD PULSEWIDTH: 0.5 ms
MDC IDC MSMT LEADCHNL RV IMPEDANCE VALUE: 512.5 Ohm
MDC IDC MSMT LEADCHNL RV PACING THRESHOLD AMPLITUDE: 1.25 V
MDC IDC MSMT LEADCHNL RV PACING THRESHOLD PULSEWIDTH: 0.5 ms
MDC IDC MSMT LEADCHNL RV SENSING INTR AMPL: 12 mV
MDC IDC PG IMPLANT DT: 20160830
MDC IDC PG SERIAL: 7810686
MDC IDC SESS DTM: 20171108155326
MDC IDC SET LEADCHNL RA PACING AMPLITUDE: 2 V
MDC IDC SET LEADCHNL RV SENSING SENSITIVITY: 2 mV
MDC IDC STAT BRADY RA PERCENT PACED: 4 %
Pulse Gen Model: 2240

## 2016-03-25 ENCOUNTER — Ambulatory Visit (HOSPITAL_COMMUNITY): Payer: Medicare Other | Attending: Cardiology

## 2016-03-25 ENCOUNTER — Other Ambulatory Visit: Payer: Self-pay

## 2016-03-25 DIAGNOSIS — I340 Nonrheumatic mitral (valve) insufficiency: Secondary | ICD-10-CM | POA: Insufficient documentation

## 2016-03-25 DIAGNOSIS — I350 Nonrheumatic aortic (valve) stenosis: Secondary | ICD-10-CM | POA: Insufficient documentation

## 2016-03-25 DIAGNOSIS — I351 Nonrheumatic aortic (valve) insufficiency: Secondary | ICD-10-CM | POA: Insufficient documentation

## 2016-04-07 ENCOUNTER — Ambulatory Visit: Payer: Self-pay | Admitting: Cardiovascular Disease

## 2016-05-20 ENCOUNTER — Telehealth: Payer: Self-pay | Admitting: Cardiology

## 2016-05-20 ENCOUNTER — Ambulatory Visit (INDEPENDENT_AMBULATORY_CARE_PROVIDER_SITE_OTHER): Payer: Medicare Other | Admitting: *Deleted

## 2016-05-20 DIAGNOSIS — I442 Atrioventricular block, complete: Secondary | ICD-10-CM

## 2016-05-20 NOTE — Telephone Encounter (Signed)
Spoke with pt and reminded pt of remote transmission that is due today. Pt verbalized understanding.   

## 2016-05-20 NOTE — Progress Notes (Signed)
Remote pacemaker transmission.   

## 2016-05-22 ENCOUNTER — Encounter: Payer: Self-pay | Admitting: Cardiology

## 2016-05-23 LAB — CUP PACEART REMOTE DEVICE CHECK
Battery Remaining Longevity: 133 mo
Battery Remaining Percentage: 95.5 %
Battery Voltage: 3.02 V
Brady Statistic AS VS Percent: 92 %
Implantable Lead Implant Date: 20160830
Implantable Pulse Generator Implant Date: 20160830
Lead Channel Pacing Threshold Amplitude: 1 V
Lead Channel Pacing Threshold Pulse Width: 0.5 ms
Lead Channel Sensing Intrinsic Amplitude: 5 mV
Lead Channel Setting Pacing Amplitude: 2.5 V
MDC IDC LEAD IMPLANT DT: 20160830
MDC IDC LEAD LOCATION: 753859
MDC IDC LEAD LOCATION: 753860
MDC IDC MSMT LEADCHNL RA IMPEDANCE VALUE: 480 Ohm
MDC IDC MSMT LEADCHNL RA PACING THRESHOLD PULSEWIDTH: 0.5 ms
MDC IDC MSMT LEADCHNL RV IMPEDANCE VALUE: 510 Ohm
MDC IDC MSMT LEADCHNL RV PACING THRESHOLD AMPLITUDE: 1.25 V
MDC IDC MSMT LEADCHNL RV SENSING INTR AMPL: 12 mV
MDC IDC PG SERIAL: 7810686
MDC IDC SESS DTM: 20180207181959
MDC IDC SET LEADCHNL RA PACING AMPLITUDE: 2 V
MDC IDC SET LEADCHNL RV PACING PULSEWIDTH: 0.5 ms
MDC IDC SET LEADCHNL RV SENSING SENSITIVITY: 2 mV
MDC IDC STAT BRADY AP VP PERCENT: 1 %
MDC IDC STAT BRADY AP VS PERCENT: 6.1 %
MDC IDC STAT BRADY AS VP PERCENT: 1 %
MDC IDC STAT BRADY RA PERCENT PACED: 5.3 %
MDC IDC STAT BRADY RV PERCENT PACED: 1 %

## 2016-08-19 ENCOUNTER — Encounter: Payer: Medicare Other | Admitting: *Deleted

## 2016-08-19 ENCOUNTER — Telehealth: Payer: Self-pay | Admitting: Cardiology

## 2016-08-19 NOTE — Telephone Encounter (Signed)
Spoke with pt and reminded pt of remote transmission that is due today. Pt verbalized understanding.   

## 2016-08-21 ENCOUNTER — Encounter: Payer: Self-pay | Admitting: Cardiology

## 2016-08-26 ENCOUNTER — Ambulatory Visit (INDEPENDENT_AMBULATORY_CARE_PROVIDER_SITE_OTHER): Payer: Medicare Other | Admitting: *Deleted

## 2016-08-26 DIAGNOSIS — I442 Atrioventricular block, complete: Secondary | ICD-10-CM

## 2016-08-27 ENCOUNTER — Encounter: Payer: Self-pay | Admitting: Cardiology

## 2016-08-27 LAB — CUP PACEART REMOTE DEVICE CHECK
Battery Remaining Longevity: 133 mo
Battery Remaining Percentage: 95.5 %
Battery Voltage: 3.02 V
Brady Statistic AP VS Percent: 5 %
Brady Statistic RA Percent Paced: 3.5 %
Date Time Interrogation Session: 20180516164238
Implantable Lead Implant Date: 20160830
Implantable Lead Location: 753859
Implantable Lead Model: 5076
Lead Channel Impedance Value: 490 Ohm
Lead Channel Pacing Threshold Pulse Width: 0.5 ms
Lead Channel Sensing Intrinsic Amplitude: 4.9 mV
MDC IDC LEAD IMPLANT DT: 20160830
MDC IDC LEAD LOCATION: 753860
MDC IDC MSMT LEADCHNL RA PACING THRESHOLD AMPLITUDE: 1 V
MDC IDC MSMT LEADCHNL RV IMPEDANCE VALUE: 490 Ohm
MDC IDC MSMT LEADCHNL RV PACING THRESHOLD AMPLITUDE: 1.25 V
MDC IDC MSMT LEADCHNL RV PACING THRESHOLD PULSEWIDTH: 0.5 ms
MDC IDC MSMT LEADCHNL RV SENSING INTR AMPL: 12 mV
MDC IDC PG IMPLANT DT: 20160830
MDC IDC SET LEADCHNL RA PACING AMPLITUDE: 2 V
MDC IDC SET LEADCHNL RV PACING AMPLITUDE: 2.5 V
MDC IDC SET LEADCHNL RV PACING PULSEWIDTH: 0.5 ms
MDC IDC SET LEADCHNL RV SENSING SENSITIVITY: 2 mV
MDC IDC STAT BRADY AP VP PERCENT: 1 %
MDC IDC STAT BRADY AS VP PERCENT: 1 %
MDC IDC STAT BRADY AS VS PERCENT: 93 %
MDC IDC STAT BRADY RV PERCENT PACED: 1 %
Pulse Gen Model: 2240
Pulse Gen Serial Number: 7810686

## 2016-08-27 NOTE — Progress Notes (Signed)
Remote pacemaker transmission.   

## 2016-11-25 ENCOUNTER — Encounter: Payer: Medicare Other | Admitting: *Deleted

## 2016-12-02 ENCOUNTER — Encounter: Payer: Self-pay | Admitting: Cardiology

## 2016-12-07 ENCOUNTER — Telehealth: Payer: Self-pay | Admitting: Internal Medicine

## 2016-12-07 ENCOUNTER — Ambulatory Visit (INDEPENDENT_AMBULATORY_CARE_PROVIDER_SITE_OTHER): Payer: Medicare Other | Admitting: *Deleted

## 2016-12-07 DIAGNOSIS — I442 Atrioventricular block, complete: Secondary | ICD-10-CM | POA: Diagnosis not present

## 2016-12-07 NOTE — Telephone Encounter (Signed)
No transmission received. Called Danny [name] and instructed him how to send a manual transmission with Merlin monitor. He was currently sending transmission during phone call- he is appreciative and denies any further questions.

## 2016-12-07 NOTE — Progress Notes (Signed)
Remote pacemaker transmission.   

## 2016-12-07 NOTE — Telephone Encounter (Signed)
°  1. Has your device fired? no  2. Is you device beeping? No   3. Are you experiencing draining or swelling at device site? No   4. Are you calling to see if we received your device transmission? yes  5. Have you passed out? no

## 2016-12-18 ENCOUNTER — Encounter: Payer: Self-pay | Admitting: Cardiology

## 2016-12-25 LAB — CUP PACEART REMOTE DEVICE CHECK
Date Time Interrogation Session: 20180914093831
Implantable Lead Implant Date: 20160830
Implantable Lead Location: 753859
Implantable Pulse Generator Implant Date: 20160830
MDC IDC LEAD IMPLANT DT: 20160830
MDC IDC LEAD LOCATION: 753860
Pulse Gen Model: 2240
Pulse Gen Serial Number: 7810686

## 2017-01-12 ENCOUNTER — Inpatient Hospital Stay (HOSPITAL_COMMUNITY)
Admission: EM | Admit: 2017-01-12 | Discharge: 2017-01-15 | DRG: 291 | Disposition: A | Discharge status: Home / Self Care | Payer: Medicare Other | Attending: Family Medicine | Admitting: Family Medicine

## 2017-01-12 ENCOUNTER — Other Ambulatory Visit: Payer: Self-pay

## 2017-01-12 ENCOUNTER — Emergency Department (HOSPITAL_COMMUNITY): Payer: Medicare Other

## 2017-01-12 ENCOUNTER — Encounter (HOSPITAL_COMMUNITY): Payer: Self-pay

## 2017-01-12 DIAGNOSIS — I441 Atrioventricular block, second degree: Secondary | ICD-10-CM | POA: Diagnosis present

## 2017-01-12 DIAGNOSIS — N179 Acute kidney failure, unspecified: Secondary | ICD-10-CM | POA: Diagnosis present

## 2017-01-12 DIAGNOSIS — L89001 Pressure ulcer of unspecified elbow, stage 1: Secondary | ICD-10-CM

## 2017-01-12 DIAGNOSIS — R748 Abnormal levels of other serum enzymes: Secondary | ICD-10-CM | POA: Diagnosis present

## 2017-01-12 DIAGNOSIS — I10 Essential (primary) hypertension: Secondary | ICD-10-CM | POA: Diagnosis not present

## 2017-01-12 DIAGNOSIS — N183 Chronic kidney disease, stage 3 (moderate): Secondary | ICD-10-CM | POA: Diagnosis present

## 2017-01-12 DIAGNOSIS — Z79899 Other long term (current) drug therapy: Secondary | ICD-10-CM | POA: Diagnosis not present

## 2017-01-12 DIAGNOSIS — R7989 Other specified abnormal findings of blood chemistry: Secondary | ICD-10-CM

## 2017-01-12 DIAGNOSIS — I130 Hypertensive heart and chronic kidney disease with heart failure and stage 1 through stage 4 chronic kidney disease, or unspecified chronic kidney disease: Secondary | ICD-10-CM | POA: Diagnosis present

## 2017-01-12 DIAGNOSIS — Z515 Encounter for palliative care: Secondary | ICD-10-CM | POA: Diagnosis present

## 2017-01-12 DIAGNOSIS — Z7189 Other specified counseling: Secondary | ICD-10-CM | POA: Diagnosis not present

## 2017-01-12 DIAGNOSIS — L989 Disorder of the skin and subcutaneous tissue, unspecified: Secondary | ICD-10-CM | POA: Diagnosis present

## 2017-01-12 DIAGNOSIS — Z950 Presence of cardiac pacemaker: Secondary | ICD-10-CM | POA: Diagnosis not present

## 2017-01-12 DIAGNOSIS — Z8249 Family history of ischemic heart disease and other diseases of the circulatory system: Secondary | ICD-10-CM

## 2017-01-12 DIAGNOSIS — Z91048 Other nonmedicinal substance allergy status: Secondary | ICD-10-CM | POA: Diagnosis not present

## 2017-01-12 DIAGNOSIS — Z66 Do not resuscitate: Secondary | ICD-10-CM | POA: Diagnosis present

## 2017-01-12 DIAGNOSIS — R778 Other specified abnormalities of plasma proteins: Secondary | ICD-10-CM

## 2017-01-12 DIAGNOSIS — L8990 Pressure ulcer of unspecified site, unspecified stage: Secondary | ICD-10-CM | POA: Insufficient documentation

## 2017-01-12 DIAGNOSIS — E871 Hypo-osmolality and hyponatremia: Secondary | ICD-10-CM | POA: Diagnosis present

## 2017-01-12 DIAGNOSIS — Z888 Allergy status to other drugs, medicaments and biological substances status: Secondary | ICD-10-CM | POA: Diagnosis not present

## 2017-01-12 DIAGNOSIS — I350 Nonrheumatic aortic (valve) stenosis: Secondary | ICD-10-CM

## 2017-01-12 DIAGNOSIS — I5023 Acute on chronic systolic (congestive) heart failure: Secondary | ICD-10-CM

## 2017-01-12 DIAGNOSIS — I5043 Acute on chronic combined systolic (congestive) and diastolic (congestive) heart failure: Secondary | ICD-10-CM | POA: Diagnosis present

## 2017-01-12 DIAGNOSIS — N289 Disorder of kidney and ureter, unspecified: Secondary | ICD-10-CM

## 2017-01-12 DIAGNOSIS — I509 Heart failure, unspecified: Secondary | ICD-10-CM | POA: Diagnosis not present

## 2017-01-12 DIAGNOSIS — I352 Nonrheumatic aortic (valve) stenosis with insufficiency: Secondary | ICD-10-CM | POA: Diagnosis present

## 2017-01-12 LAB — CBC WITH DIFFERENTIAL/PLATELET
BASOS ABS: 0 10*3/uL (ref 0.0–0.1)
Basophils Relative: 0 %
Eosinophils Absolute: 0.1 10*3/uL (ref 0.0–0.7)
Eosinophils Relative: 1 %
HEMATOCRIT: 39.6 % (ref 39.0–52.0)
Hemoglobin: 13.4 g/dL (ref 13.0–17.0)
LYMPHS ABS: 0.8 10*3/uL (ref 0.7–4.0)
LYMPHS PCT: 12 %
MCH: 32.3 pg (ref 26.0–34.0)
MCHC: 33.8 g/dL (ref 30.0–36.0)
MCV: 95.4 fL (ref 78.0–100.0)
Monocytes Absolute: 0.9 10*3/uL (ref 0.1–1.0)
Monocytes Relative: 15 %
NEUTROS ABS: 4.5 10*3/uL (ref 1.7–7.7)
Neutrophils Relative %: 72 %
PLATELETS: 161 10*3/uL (ref 150–400)
RBC: 4.15 MIL/uL — AB (ref 4.22–5.81)
RDW: 16.8 % — ABNORMAL HIGH (ref 11.5–15.5)
WBC: 6.3 10*3/uL (ref 4.0–10.5)

## 2017-01-12 LAB — BASIC METABOLIC PANEL
Anion gap: 10 (ref 5–15)
BUN: 58 mg/dL — AB (ref 6–20)
CHLORIDE: 102 mmol/L (ref 101–111)
CO2: 20 mmol/L — AB (ref 22–32)
Calcium: 8.9 mg/dL (ref 8.9–10.3)
Creatinine, Ser: 2.18 mg/dL — ABNORMAL HIGH (ref 0.61–1.24)
GFR calc Af Amer: 29 mL/min — ABNORMAL LOW (ref >60)
GFR, EST NON AFRICAN AMERICAN: 25 mL/min — AB (ref >60)
GLUCOSE: 148 mg/dL — AB (ref 65–99)
POTASSIUM: 5 mmol/L (ref 3.5–5.1)
Sodium: 132 mmol/L — ABNORMAL LOW (ref 135–145)

## 2017-01-12 LAB — TROPONIN I
TROPONIN I: 0.18 ng/mL — AB (ref <0.03)
Troponin I: 0.09 ng/mL (ref <0.03)

## 2017-01-12 MED ORDER — FUROSEMIDE 10 MG/ML IJ SOLN
40.0000 mg | Freq: Two times a day (BID) | INTRAMUSCULAR | Status: DC
  Administered 2017-01-13 – 2017-01-15 (×5): 40 mg via INTRAVENOUS
  Filled 2017-01-12 (×5): qty 4

## 2017-01-12 MED ORDER — FUROSEMIDE 10 MG/ML IJ SOLN
40.0000 mg | Freq: Once | INTRAMUSCULAR | Status: AC
  Administered 2017-01-12: 40 mg via INTRAVENOUS
  Filled 2017-01-12: qty 4

## 2017-01-12 NOTE — ED Notes (Signed)
Date and time results received: 01/12/17 3:19 PM  Test: troponin  Critical Value: 0.09  Name of Provider Notified: Adriana Simas  Orders Received? Or Actions Taken?: No new orders at this time

## 2017-01-12 NOTE — ED Triage Notes (Signed)
Pt reports sob x3 days.  Denies cough or cold symptoms.  Pt denies any chest pain or tightness but family says pt has had some chest pain.

## 2017-01-12 NOTE — H&P (Signed)
History and Physical    Danny [name] ZOX:[id number] DOB: Mar 23, 1923 DOA: 01/12/2017  PCP: Oval Linsey, MD Patient coming from: Home  Chief Complaint: Shortness of breath  HPI: Danny [name] is a 83 y.o. male with medical history significant of hypertension, congestive systolic CHF ejection fraction 30%, severe aortic stenosis, Mobitz type II AV block, esophageal strictures came to the hospital with complains of 3 days of shortness of breath. Patient states at baseline he is able to walk around and perform his ADLs less without any difficulty. He is able to walk to his mailbox and around his home without any difficulty but over last 3 days he has noticed he has been getting progressively shortness of breath where he is not able to more around as much as he used to. He denies any cough, chest pain, fevers, chills, lower extremity swelling and any other complaints. According to him and the daughter Asian denies any recent medication, dietary or any other changes.  His previous echo from December 2017 shows ejection fraction 30%, grade 2 diastolic dysfunction, mild-to-moderate aortic regurgitation and low gradient severe aortic stenosis. He also has a pacemaker in place and follows Dr. Excell Seltzer and Dr Johney Frame. Previously patient has denied any surgical intervention for his aortic stenosis.  In the ER patient was noted to be mildly short of breath and his chest x-ray suggested bilateral pleural effusion and pulmonary vascular congestion, his troponins were mildly elevated at 0.09 which were previously normal. His creatinine was elevated at 2.18, his baseline is around 1.5. EKG showed AV paced rhythm without any signs of acute ischemia. Patient was seen by the cardiologist in the ER who recommended conservative management with diuresis.   Review of Systems: As per HPI otherwise 10 point review of systems negative.   Past Medical History:  Diagnosis Date  . Esophageal stricture    dilatation  remotely no recurrent problems  . Hypertension   . Second degree Mobitz II AV block   . Severe aortic stenosis     Past Surgical History:  Procedure Laterality Date  . APPENDECTOMY    . EP IMPLANTABLE DEVICE N/A 12/11/2014   SJM Assurity DR implanted by Dr Johney Frame for mobitz II second degree AV block  . PACEMAKER IMPLANT    . TONSILLECTOMY     remote     reports that he has never smoked. He has never used smokeless tobacco. He reports that he does not drink alcohol or use drugs.  Allergies  Allergen Reactions  . Other Other (See Comments)    SKIN IS VERY THIN; TAPE WILL TEAR THE SKIN AND BRUISE IT!!  . Tape Other (See Comments)    SKIN IS VERY THIN; TAPE WILL TEAR THE SKIN AND BRUISE IT!!    Family History  Problem Relation Age of Onset  . Hypertension Mother   . Heart disease Father   . Heart disease Brother        D/C 1996  . Other Sister        DROWNED AT 12  . Other Sister        HEALTHY AGE 63  . Heart disease Sister        PACER AGE 61  . CAD Neg Hx     Acceptable: Family history reviewed and not pertinent (If you reviewed it)  Prior to Admission medications   Medication Sig Start Date End Date Taking? Authorizing Provider  lisinopril-hydrochlorothiazide (PRINZIDE,ZESTORETIC) 20-12.5 MG tablet Take 1 tablet by mouth daily.  Yes [provider]    Physical Exam: Vitals:   01/12/17 1301 01/12/17 1421 01/12/17 1500 01/12/17 1530  BP: 110/75 121/72 109/74 118/78  Pulse: 93 78 82 78  Resp: 17 (!) 22 13 (!) 21  Temp: (!) 97.3 F (36.3 C)     TempSrc: Temporal     SpO2: 98% 94% 100% 100%  Weight: 68.9 kg (152 lb)     Height:  (1.702 m)         Constitutional: NAD, calm, comfortable Vitals:   01/12/17 1301 01/12/17 1421 01/12/17 1500 01/12/17 1530  BP: 110/75 121/72 109/74 118/78  Pulse: 93 78 82 78  Resp: 17 (!) 22 13 (!) 21  Temp: (!) 97.3 F (36.3 C)     TempSrc: Temporal     SpO2: 98% 94% 100% 100%  Weight: 68.9 kg (152 lb)       Height:  (1.702 m)      Eyes: PERRL, lids and conjunctivae normal ENMT: Mucous membranes are moist. Posterior pharynx clear of any exudate or lesions.Normal dentition.  Neck: normal, supple, no masses, no thyromegaly Respiratory: Bibasilar diminished breath sounds with mild crackles Cardiovascular: Pacemaker noted in the left chest wall, Regular rate and rhythm, systolic murmur heard in the right second intercostal space radiating to his right carotid, no / rubs / gallops. No extremity edema. 2+ pedal pulses. No carotid bruits.  Abdomen: no tenderness, no masses palpated. No hepatosplenomegaly. Bowel sounds positive.  Musculoskeletal: no clubbing / cyanosis. No joint deformity upper and lower extremities. Good ROM, no contractures. Normal muscle tone.  Skin: no rashes, lesions, ulcers. No induration Neurologic: CN 2-12 grossly intact. Sensation intact, DTR normal. Strength 5/5 in all 4.  Psychiatric: Normal judgment and insight. Alert and oriented x 3. Normal mood.     Labs on Admission: I have personally reviewed following labs and imaging studies  CBC:  Recent Labs Lab 01/12/17 1427  WBC 6.3  NEUTROABS 4.5  HGB 13.4  HCT 39.6  MCV 95.4  PLT 161   Basic Metabolic Panel:  Recent Labs Lab 01/12/17 1427  NA 132*  K 5.0  CL 102  CO2 20*  GLUCOSE 148*  BUN 58*  CREATININE 2.18*  CALCIUM 8.9   GFR: Estimated Creatinine Clearance: 20.6 mL/min (A) (by C-G formula based on SCr of 2.18 mg/dL (H)). Liver Function Tests: No results for input(s): AST, ALT, ALKPHOS, BILITOT, PROT, ALBUMIN in the last 168 hours. No results for input(s): LIPASE, AMYLASE in the last 168 hours. No results for input(s): AMMONIA in the last 168 hours. Coagulation Profile: No results for input(s): INR, PROTIME in the last 168 hours. Cardiac Enzymes:  Recent Labs Lab 01/12/17 1427  TROPONINI 0.09*   BNP (last 3 results) No results for input(s): PROBNP in the last 8760 hours. HbA1C: No  results for input(s): HGBA1C in the last 72 hours. CBG: No results for input(s): GLUCAP in the last 168 hours. Lipid Profile: No results for input(s): CHOL, HDL, LDLCALC, TRIG, CHOLHDL, LDLDIRECT in the last 72 hours. Thyroid Function Tests: No results for input(s): TSH, T4TOTAL, FREET4, T3FREE, THYROIDAB in the last 72 hours. Anemia Panel: No results for input(s): VITAMINB12, FOLATE, FERRITIN, TIBC, IRON, RETICCTPCT in the last 72 hours. Urine analysis:    Component Value Date/Time   COLORURINE YELLOW 12/10/2014 1740   APPEARANCEUR CLEAR 12/10/2014 1740   LABSPEC 1.020 12/10/2014 1740   PHURINE 6.0 12/10/2014 1740   GLUCOSEU NEGATIVE 12/10/2014 1740   HGBUR NEGATIVE 12/10/2014 1740  BILIRUBINUR NEGATIVE 12/10/2014 1740   KETONESUR NEGATIVE 12/10/2014 1740   PROTEINUR NEGATIVE 12/10/2014 1740   UROBILINOGEN 0.2 12/10/2014 1740   NITRITE NEGATIVE 12/10/2014 1740   LEUKOCYTESUR NEGATIVE 12/10/2014 1740   Sepsis Labs: !!!!!!!!!!!!!!!!!!!!!!!!!!!!!!!!!!!!!!!!!!!! (procalcitonin:4,lacticidven:4) )No results found for this or any previous visit (from the past 240 hour(s)).   Radiological Exams on Admission: Dg Chest 2 View  Result Date: 01/12/2017 CLINICAL DATA:  Shortness of breath and weakness. EXAM: CHEST  2 VIEW COMPARISON:  12/12/2014 FINDINGS: There is a left chest wall pacer device with lead in the right atrial appendage right ventricle peer stable cardiac enlargement. Moderate to large left pleural effusion and small right effusion. Diffuse pulmonary vascular congestion noted. IMPRESSION: 1. Suspect congestive heart failure. Electronically Signed   By: Signa Kell M.D.   On: 01/12/2017 13:21    EKG: Independently reviewed. AV paced rhythm  Assessment/Plan Active Problems:   Acute exacerbation of CHF (congestive heart failure) (HCC)   Shortness of breath Acute systolic exacerbation of CHF, class III Severe symptomatic aortic stenosis -Admit to  telemetry -Trend cardiac enzymes, order Lasix 40 mg IV twice daily -Strict input and output, daily weights and fluid restriction 1800 mL -Patient evaluated by cardiology-hold off on repeating echocardiogram as this will not change management. Family does not want to pursue any surgical intervention. Palliative care consult placed. -At this time the goal would be to make him euvolemic and make and symptomatically feel better -Antihypertensive on hold while he is getting diuresed -Supportive care, supplemental oxygen if needed  Acute kidney injury on CKG stage III -Likely secondary to volume overload, hopefully it will improve diuresis -Avoid nephrotoxic drugs and closely monitor renal function along with his urine output.  History of hypertension -Home medications on hold  AV node block Mobitz type II -Pacemaker in place -Follow-up outpatient.    DVT prophylaxis: SCDs Code Status: DO NOT RESUSCITATE Family Communication: Daughter and son at bedside Disposition Plan: Likely can be discharged home after he has been diuresed and reached euvolemic state. Consults called: Cardiology, palliative care Admission status: Inpatient telemetry  It is my clinical opinion that admission to INPATIENT is reasonable and necessary in this 83 y.o. male . presenting with symptoms of shortness of breath, concerning for CHF exacerbation  . in the context of PMH including: Systolic CHF, severe aortic stenosis . with pertinent positives on physical exam including: Diminished breath sounds with bibasilar crackles and systolic murmur . and pertinent positives on radiographic and laboratory data including: Pleural effusion sees on chest x-ray . Workup and treatment include IV diuresis and monitoring renal function.   Given the aforementioned, the predictability of an adverse outcome is felt to be significant. I expect that the patient will require at least 2 midnights in the hospital to treat this  condition.     Bernie Fobes Joline Maxcy MD [facility]   If 7PM-7AM, please contact night-coverage [email or web address] Password [email or web address]  01/12/2017, 4:57 PM

## 2017-01-12 NOTE — ED Provider Notes (Signed)
[facility]-EMERGENCY DEPT Provider Note   CSN: [id number] Arrival date & time: 01/12/17  1254     History   Chief Complaint Chief Complaint  Patient presents with  . Shortness of Breath    HPI Danny [name] is a 83 y.o. male.  Dyspnea for several days, getting worse. No such to chest pain. Patient has known severe aortic stenosis is not an operative candidate. Exertion makes symptoms worse. No prodromal fever, sweats, chills, productive cough. Severity of symptoms is moderate.      Past Medical History:  Diagnosis Date  . Esophageal stricture    dilatation remotely no recurrent problems  . Hypertension   . Second degree Mobitz II AV block   . Severe aortic stenosis     Patient Active Problem List   Diagnosis Date Noted  . Second degree heart block 12/10/2014  . Complete heart block (HCC) 12/10/2014  . Symptomatic bradycardia 12/10/2014  . RBBB 12/10/2014  . Essential hypertension 12/10/2014  . Hyperglycemia 12/10/2014  . Renal insufficiency 12/10/2014    Past Surgical History:  Procedure Laterality Date  . APPENDECTOMY    . EP IMPLANTABLE DEVICE N/A 12/11/2014   SJM Assurity DR implanted by Dr Johney Frame for mobitz II second degree AV block  . PACEMAKER IMPLANT    . TONSILLECTOMY     remote       Home Medications    Prior to Admission medications   Medication Sig Start Date End Date Taking? Authorizing Provider  lisinopril-hydrochlorothiazide (PRINZIDE,ZESTORETIC) 20-12.5 MG tablet Take 1 tablet by mouth daily.   Yes [provider]    Family History Family History  Problem Relation Age of Onset  . Hypertension Mother   . Heart disease Father   . Heart disease Brother        D/C 1996  . Other Sister        DROWNED AT 38  . Other Sister        HEALTHY AGE 2  . Heart disease Sister        PACER AGE 5  . CAD Neg Hx     Social History Social History  Substance Use Topics  . Smoking status: Never Smoker  . Smokeless tobacco: Never Used   . Alcohol use No     Allergies   Other and Tape   Review of Systems Review of Systems  All other systems reviewed and are negative.    Physical Exam Updated Vital Signs BP 109/74   Pulse 82   Temp (!) 97.3 F (36.3 C) (Temporal)   Resp 13   Ht  (1.702 m)   Wt 68.9 kg (152 lb)   SpO2 100%   BMI 23.81 kg/m   Physical Exam  Constitutional: He is oriented to person, place, and time. He appears well-developed and well-nourished.  HENT:  Head: Normocephalic and atraumatic.  Eyes: Conjunctivae are normal.  Neck: Neck supple.  Cardiovascular: Normal rate and regular rhythm.   Pulmonary/Chest: Effort normal and breath sounds normal.  Abdominal: Soft. Bowel sounds are normal.  Musculoskeletal: Normal range of motion.  Neurological: He is alert and oriented to person, place, and time.  Skin: Skin is warm and dry.  Psychiatric: He has a normal mood and affect. His behavior is normal.  Nursing note and vitals reviewed.    ED Treatments / Results  Labs (all labs ordered are listed, but only abnormal results are displayed) Labs Reviewed  CBC WITH DIFFERENTIAL/PLATELET - Abnormal; Notable for the following:  Result Value   RBC 4.15 (*)    RDW 16.8 (*)    All other components within normal limits  BASIC METABOLIC PANEL - Abnormal; Notable for the following:    Sodium 132 (*)    CO2 20 (*)    Glucose, Bld 148 (*)    BUN 58 (*)    Creatinine, Ser 2.18 (*)    GFR calc non Af Amer 25 (*)    GFR calc Af Amer 29 (*)    All other components within normal limits  TROPONIN I - Abnormal; Notable for the following:    Troponin I 0.09 (*)    All other components within normal limits    EKG  EKG Interpretation  Date/Time:  Tuesday January 12 2017 13:07:41 EDT Ventricular Rate:  88 PR Interval:    QRS Duration: 204 QT Interval:  486 QTC Calculation: 588 R Axis:   -65 Text Interpretation:  Atrial-sensed ventricular-paced rhythm Abnormal ECG No significant  change since last tracing Confirmed by Doug Sou [phone]) on 01/12/2017 1:13:09 PM       Radiology Dg Chest 2 View  Result Date: 01/12/2017 CLINICAL DATA:  Shortness of breath and weakness. EXAM: CHEST  2 VIEW COMPARISON:  12/12/2014 FINDINGS: There is a left chest wall pacer device with lead in the right atrial appendage right ventricle peer stable cardiac enlargement. Moderate to large left pleural effusion and small right effusion. Diffuse pulmonary vascular congestion noted. IMPRESSION: 1. Suspect congestive heart failure. Electronically Signed   By: Signa Kell M.D.   On: 01/12/2017 13:21    Procedures Procedures (including critical care time)  Medications Ordered in ED Medications  furosemide (LASIX) injection 40 mg (40 mg Intravenous Given 01/12/17 1524)     Initial Impression / Assessment and Plan / ED Course  I have reviewed the triage vital signs and the nursing notes.  Pertinent labs & imaging results that were available during my care of the patient were reviewed by me and considered in my medical decision making (see chart for details).     Patient with known severe aortic stenosis presents with dyspnea.  He is not in extremist. Chest x-ray reveals congestive heart failure. Troponin elevated at 0.09. Discussed with cardiologist who will consult. Admit to general medicine.  Final Clinical Impressions(s) / ED Diagnoses   Final diagnoses:  Acute congestive heart failure, unspecified heart failure type (HCC)  Aortic valve stenosis, etiology of cardiac valve disease unspecified  Elevated troponin    New Prescriptions New Prescriptions   No medications on file     Donnetta Hutching, MD 01/12/17 1606

## 2017-01-12 NOTE — Consult Note (Addendum)
Cardiology Consultation:   Patient ID: Danny [name]; [id number]; Dec 02, 1922   Admit date: 01/12/2017 Date of Consult: 01/12/2017  Primary Care Provider: Oval Linsey, MD Primary Cardiologist: Dr. Excell Seltzer Primary Electrophysiologist:  Dr. Johney Frame   Patient Profile:   Danny [name] is a 83 y.o. male with a hx of severe aortic stenosis who is being seen today for the evaluation of congestive heart failure, chest pain, and elevated troponin at the request of Dr. Adriana Simas and Dr. Nelson Chimes.  History of Present Illness:   [name] is a 83 year old gentleman with a history of severe aortic stenosis and second-degree Mobitz 2 heart block and has a pacemaker.  For the past 3 days, he has been progressively more short of breath. He denies orthopnea and paroxysmal nocturnal dyspnea. He also denies leg swelling.  He saw Dr. Excell Seltzer on 06/21/15. He has another appointment with him next month.  During that visit, echocardiogram showed severe aortic stenosis with moderately reduced left ventricular systolic function, LVEF 35-40%. There was moderate diastolic dysfunction with elevated left ventricular filling pressures, mildly decreased right ventricular systolic function, mild to moderate mitral and mild aortic regurgitation.  He reported exertional dyspnea to Dr. Excell Seltzer at that visit. The patient did not want to pursue transcatheter aortic valve replacement. Dr. Excell Seltzer noted that the patient's prognosis was poor in the setting of symptomatic severe aortic stenosis and left ventricular dysfunction.  A follow-up echocardiogram performed on 03/25/16 demonstrated progressive left ventricular systolic dysfunction, LVEF now 30%. There was grade 2 diastolic dysfunction with elevated filling pressures. There was mild to moderate aortic regurgitation. There was low gradient severe aortic stenosis.  Today's chest x-ray shows a moderate to large left pleural effusion and small right pleural effusion with  diffuse pulmonary vascular congestion, indicative of congestive heart failure.  Troponin is minimally elevated at 0.09.  BUN and creatinine are both elevated, 58 and 2.18, respectively.  He is also mildly hyponatremic, sodium 132.  ECG performed today which I personally interpreted demonstrated an atrial sensed ventricular paced rhythm.  The patient, his daughter Sedalia Muta, who works in ED registration at [employer]), and son were present at the time of my evaluation.  The patient said he had been married for 71 years and his wife passed away 3 years ago. He is tearful when talking about this. I later spoke with his daughter who said that her father misses his wife very much and expresses this on a daily basis.  We spoke at length about the natural history of severe symptomatic aortic stenosis and left ventricular dysfunction.  The patient and his family are clear that no aggressive interventions are to be pursued.   Social history: He was married for 71 years. His wife passed away 3 years ago. He has a daughter and son. He works for [employer] for 14 years and then [employer] for several decades and retired at the age of 84.  Past Medical History:  Diagnosis Date  . Esophageal stricture    dilatation remotely no recurrent problems  . Hypertension   . Second degree Mobitz II AV block   . Severe aortic stenosis     Past Surgical History:  Procedure Laterality Date  . APPENDECTOMY    . EP IMPLANTABLE DEVICE N/A 12/11/2014   SJM Assurity DR implanted by Dr Johney Frame for mobitz II second degree AV block  . PACEMAKER IMPLANT    . TONSILLECTOMY     remote  Inpatient Medications: Scheduled Meds:  Continuous Infusions:  PRN Meds:   Allergies:    Allergies  Allergen Reactions  . Other Other (See Comments)    SKIN IS VERY THIN; TAPE WILL TEAR THE SKIN AND BRUISE IT!!  . Tape Other (See Comments)    SKIN IS VERY THIN; TAPE WILL TEAR THE SKIN AND  BRUISE IT!!    Social History:   Social History   Social History  . Marital status: Widowed    Spouse name: N/A  . Number of children: N/A  . Years of education: N/A   Occupational History  . Not on file.   Social History Main Topics  . Smoking status: Never Smoker  . Smokeless tobacco: Never Used  . Alcohol use No  . Drug use: No  . Sexual activity: Not on file   Other Topics Concern  . Not on file   Social History Narrative   ** Merged History Encounter **       Widower, nonsmoker, no Etoh. Supervisor at [employer]    Family History:    Family History  Problem Relation Age of Onset  . Hypertension Mother   . Heart disease Father   . Heart disease Brother        D/C 1996  . Other Sister        DROWNED AT 24  . Other Sister        HEALTHY AGE 48  . Heart disease Sister        PACER AGE 43  . CAD Neg Hx      ROS:  Please see the history of present illness.  ROS  All other ROS reviewed and negative.     Physical Exam/Data:   Vitals:   01/12/17 1301 01/12/17 1421 01/12/17 1500  BP: 110/75 121/72 109/74  Pulse: 93 78 82  Resp: 17 (!) 22 13  Temp: (!) 97.3 F (36.3 C)    TempSrc: Temporal    SpO2: 98% 94% 100%  Weight: 152 lb (68.9 kg)    Height:  (1.702 m)     No intake or output data in the 24 hours ending 01/12/17 1620 Filed Weights   01/12/17 1301  Weight: 152 lb (68.9 kg)   Body mass index is 23.81 kg/m.  General:  Well nourished, well developed, in no acute distress, sitting up in bed. HEENT: normal Lymph: no adenopathy Neck: +JVD Endocrine:  No thryomegaly Cardiac:  normal S1, diminished S2; RRR; 3/6 late-peaking ejections systolic murmur Lungs: No obvious crackles on exam, diminished at bases, no wheezes. Abd: soft, nontender, no hepatomegaly  Ext: no edema Musculoskeletal:  No deformities, BUE and BLE strength normal and equal Skin: warm and dry  Neuro:  No focal abnormalities noted Psych:  Normal affect      Relevant CV Studies: See echocardiograms above.  Laboratory Data:  Chemistry Recent Labs Lab 01/12/17 1427  NA 132*  K 5.0  CL 102  CO2 20*  GLUCOSE 148*  BUN 58*  CREATININE 2.18*  CALCIUM 8.9  GFRNONAA 25*  GFRAA 29*  ANIONGAP 10    No results for input(s): PROT, ALBUMIN, AST, ALT, ALKPHOS, BILITOT in the last 168 hours. Hematology Recent Labs Lab 01/12/17 1427  WBC 6.3  RBC 4.15*  HGB 13.4  HCT 39.6  MCV 95.4  MCH 32.3  MCHC 33.8  RDW 16.8*  PLT 161   Cardiac Enzymes Recent Labs Lab 01/12/17 1427  TROPONINI 0.09*   No results for  input(s): TROPIPOC in the last 168 hours.  BNPNo results for input(s): BNP, PROBNP in the last 168 hours.  DDimer No results for input(s): DDIMER in the last 168 hours.  Radiology/Studies:  Dg Chest 2 View  Result Date: 01/12/2017 CLINICAL DATA:  Shortness of breath and weakness. EXAM: CHEST  2 VIEW COMPARISON:  12/12/2014 FINDINGS: There is a left chest wall pacer device with lead in the right atrial appendage right ventricle peer stable cardiac enlargement. Moderate to large left pleural effusion and small right effusion. Diffuse pulmonary vascular congestion noted. IMPRESSION: 1. Suspect congestive heart failure. Electronically Signed   By: Signa Kell M.D.   On: 01/12/2017 13:21    Assessment and Plan:   1. Severe symptomatic aortic stenosis with severe left ventricular dysfunction: This is end-stage and the patient is very clear that he does not want aggressive intervention. I discussed the terminal nature of his aortic valve and cardiac disease with the patient, his daughter, and son. I have recommended a palliative care consult. The patient's daughter, Sedalia Muta, is in agreement with this. I would recommend intravenous diuresis for symptom relief in the meantime. I would not obtain a repeat echocardiogram because it will not change management. 2. Acute on chronic combined systolic and diastolic congestive heart failure:  This is due to severe aortic stenosis and severe left ventricular systolic and diastolic dysfunction. As stated above, I recommend intravenous diuresis for symptom relief. Ultimately, I would recommend a palliative care consult which I will request. 3.   Hypertension: Blood pressures are currently normal. I would hold       antihypertensive therapy as diuretics are administered. 4.   Mobitz 2 heart block with pacemaker: Device function is normal.  Disposition: I will place a palliative care consult. I will sign off inpatient care. I had a lengthy discussion with the patient and his children. Diane is in favor of palliative care and possibly no hospital readmission.   For questions or updates, please contact [facility] Please consult [email or web address] for contact info under Cardiology/STEMI.   Signed, Prentice Docker, MD  01/12/2017 4:20 PM   ADDENDUM: Approached by daughter, Diane, this morning. She requests discontinuation of palliative care consult and prefers to think about things at this time. I have obliged. She is very appreciative for my evaluation and discussion with the patient and family members yesterday. I have signed off inpatient care.

## 2017-01-13 DIAGNOSIS — L8990 Pressure ulcer of unspecified site, unspecified stage: Secondary | ICD-10-CM | POA: Insufficient documentation

## 2017-01-13 LAB — TROPONIN I
TROPONIN I: 0.2 ng/mL — AB (ref <0.03)
Troponin I: 0.19 ng/mL (ref <0.03)

## 2017-01-13 LAB — BASIC METABOLIC PANEL
Anion gap: 8 (ref 5–15)
BUN: 62 mg/dL — ABNORMAL HIGH (ref 6–20)
CO2: 22 mmol/L (ref 22–32)
Calcium: 8.9 mg/dL (ref 8.9–10.3)
Chloride: 103 mmol/L (ref 101–111)
Creatinine, Ser: 2.12 mg/dL — ABNORMAL HIGH (ref 0.61–1.24)
GFR calc Af Amer: 30 mL/min — ABNORMAL LOW (ref >60)
GFR, EST NON AFRICAN AMERICAN: 26 mL/min — AB (ref >60)
GLUCOSE: 109 mg/dL — AB (ref 65–99)
POTASSIUM: 4.5 mmol/L (ref 3.5–5.1)
Sodium: 133 mmol/L — ABNORMAL LOW (ref 135–145)

## 2017-01-13 LAB — MAGNESIUM: Magnesium: 2 mg/dL (ref 1.7–2.4)

## 2017-01-13 NOTE — Progress Notes (Signed)
Appreciate cardiology note and expertise. Difficult situation combination of diminished systolic function EF 30% with tight aortic stenosis patient in process of being diuresed feels better less dyspnea less orthopnea patient sleeps recliner at home I offered a hospital bed they Ae rnot interested at present. Creatinine is 2.1 to monitor renal function electrolytes potassium and magnesium with moderate diuresis and symptoms patient is a DO NOT RESUSCITATE reconfirmed this with patient and family Danny [name] ZOX:[id number] DOB: Mar 15, 1923 DOA: 01/12/2017 PCP: Oval Linsey, MD   Physical Exam: Blood pressure (!) 114/57, pulse 81, temperature (!) 97.3 F (36.3 C), temperature source Oral, resp. rate 18, height  (1.702 m), weight 63.6 kg (140 lb 3.4 oz), SpO2 100 %. Lungs diminished breath sounds in the bases prolonged x-ray phase no rales no wheezes audible heart regular rhythm no S3 auscultated no heaves thrills rubs abdomen soft nontender bowel sounds normoactive   Investigations:  No results found for this or any previous visit (from the past 240 hour(s)).   Basic Metabolic Panel:  Recent Labs  81/19/14 1427 01/13/17 0559  NA 132* 133*  K 5.0 4.5  CL 102 103  CO2 20* 22  GLUCOSE 148* 109*  BUN 58* 62*  CREATININE 2.18* 2.12*  CALCIUM 8.9 8.9  MG  --  2.0   Liver Function Tests: No results for input(s): AST, ALT, ALKPHOS, BILITOT, PROT, ALBUMIN in the last 72 hours.   CBC:  Recent Labs  01/12/17 1427  WBC 6.3  NEUTROABS 4.5  HGB 13.4  HCT 39.6  MCV 95.4  PLT 161    Dg Chest 2 View  Result Date: 01/12/2017 CLINICAL DATA:  Shortness of breath and weakness. EXAM: CHEST  2 VIEW COMPARISON:  12/12/2014 FINDINGS: There is a left chest wall pacer device with lead in the right atrial appendage right ventricle peer stable cardiac enlargement. Moderate to large left pleural effusion and small right effusion. Diffuse pulmonary vascular congestion noted. IMPRESSION: 1.  Suspect congestive heart failure. Electronically Signed   By: Signa Kell M.D.   On: 01/12/2017 13:21      Medication  Impression:  Active Problems:   Acute exacerbation of CHF (congestive heart failure) (HCC)   Pressure injury of skin     Plan: Continue diuresis. Monitor renal function and electrolytes. Daily. Patient is DO NOT RESUSCITATE   Consultants: Cardiology   Procedures   Antibiotics:          Time spent: 30 minutes   LOS: 1 day   Itali Mckendry M   01/13/2017, 1:27 PM

## 2017-01-13 NOTE — Progress Notes (Signed)
Pt's Troponin 0.19. MD notified. Pt is free from pain and resting comfortably. Will continue to monitor  Genelle Bal, RN

## 2017-01-14 LAB — BASIC METABOLIC PANEL
Anion gap: 11 (ref 5–15)
BUN: 62 mg/dL — AB (ref 6–20)
CALCIUM: 9 mg/dL (ref 8.9–10.3)
CHLORIDE: 98 mmol/L — AB (ref 101–111)
CO2: 23 mmol/L (ref 22–32)
CREATININE: 2.03 mg/dL — AB (ref 0.61–1.24)
GFR, EST AFRICAN AMERICAN: 31 mL/min — AB (ref >60)
GFR, EST NON AFRICAN AMERICAN: 27 mL/min — AB (ref >60)
Glucose, Bld: 111 mg/dL — ABNORMAL HIGH (ref 65–99)
Potassium: 4.3 mmol/L (ref 3.5–5.1)
SODIUM: 132 mmol/L — AB (ref 135–145)

## 2017-01-14 LAB — MAGNESIUM: MAGNESIUM: 1.9 mg/dL (ref 1.7–2.4)

## 2017-01-14 NOTE — Progress Notes (Signed)
Patient clinically moderately improved less dyspnea orthopnea. No chest pain. No dizziness discussed with family the benefit of 24 hours further of diuresis intravenously with discharge on Lasix they seem to agree we will monitor her renal function improved to 2.03 creatinine today Henrry Feil Kirt BJY:[id number] DOB: June 18, 1922 DOA: 01/12/2017 PCP: Oval Linsey, MD   Physical Exam: Blood pressure 106/62, pulse 61, temperature 98 F (36.7 C), temperature source Oral, resp. rate 18, height  (1.702 m), weight 62.4 kg (137 lb 9.1 oz), SpO2 97 %. Lungs diminished breath sounds in the bases no rales audible prolonged x-ray phase no wheeze no rhonchi appreciable heart regular rhythm 2/6 aortic flow murmur no S3 auscultated   Investigations:  No results found for this or any previous visit (from the past 240 hour(s)).   Basic Metabolic Panel:  Recent Labs  08/65/78 0559 01/14/17 0504  NA 133* 132*  K 4.5 4.3  CL 103 98*  CO2 22 23  GLUCOSE 109* 111*  BUN 62* 62*  CREATININE 2.12* 2.03*  CALCIUM 8.9 9.0  MG 2.0 1.9   Liver Function Tests: No results for input(s): AST, ALT, ALKPHOS, BILITOT, PROT, ALBUMIN in the last 72 hours.   CBC:  Recent Labs  01/12/17 1427  WBC 6.3  NEUTROABS 4.5  HGB 13.4  HCT 39.6  MCV 95.4  PLT 161    Dg Chest 2 View  Result Date: 01/12/2017 CLINICAL DATA:  Shortness of breath and weakness. EXAM: CHEST  2 VIEW COMPARISON:  12/12/2014 FINDINGS: There is a left chest wall pacer device with lead in the right atrial appendage right ventricle peer stable cardiac enlargement. Moderate to large left pleural effusion and small right effusion. Diffuse pulmonary vascular congestion noted. IMPRESSION: 1. Suspect congestive heart failure. Electronically Signed   By: Signa Kell M.D.   On: 01/12/2017 13:21      Medications  Impression:  Active Problems:   Acute exacerbation of CHF (congestive heart failure) (HCC)   Pressure injury of  skin     Plan:Continue Lasix 40 IV every 12 hours will consider discharge in 24 hours on oral Lasix monitor renal function and electrolytes in a.m.  Consultants: Cardiology   Procedures   Antibiotics:       Time spent: 30 minutes   LOS: 2 days   Loomis Anacker M   01/14/2017, 12:43 PM

## 2017-01-14 NOTE — Care Management Note (Signed)
Case Management Note  Patient Details  Name: Danny [name] MRN: [id number] Date of Birth: [birth year]  Subjective/Objective:                  Admitted with CHF. Pt is from home, lives alone, he has son and dtr that live across the street. He is ind with ADL's. He uses no DME but has "lots of stuff" in his basement. He has PCP, drives himself to appointments. He has insurance with drug coverage and no difficulty affording or managing medications. He wants to go home. He communicates no needs or concerns about DC plan.   Action/Plan: DC home with self care. CM will follow to DC.  Expected Discharge Date:      01/14/2017            Expected Discharge Plan:  Home/Self Care  In-House Referral:  NA  Discharge planning Services  CM Consult  Post Acute Care Choice:  NA Choice offered to:  NA  Status of Service:  In process, will continue to follow  Malcolm Metro, RN 01/14/2017, 9:36 AM

## 2017-01-15 LAB — BASIC METABOLIC PANEL
ANION GAP: 12 (ref 5–15)
BUN: 61 mg/dL — ABNORMAL HIGH (ref 6–20)
CHLORIDE: 94 mmol/L — AB (ref 101–111)
CO2: 26 mmol/L (ref 22–32)
CREATININE: 2.12 mg/dL — AB (ref 0.61–1.24)
Calcium: 8.8 mg/dL — ABNORMAL LOW (ref 8.9–10.3)
GFR calc non Af Amer: 26 mL/min — ABNORMAL LOW (ref >60)
GFR, EST AFRICAN AMERICAN: 30 mL/min — AB (ref >60)
Glucose, Bld: 108 mg/dL — ABNORMAL HIGH (ref 65–99)
POTASSIUM: 3.9 mmol/L (ref 3.5–5.1)
Sodium: 132 mmol/L — ABNORMAL LOW (ref 135–145)

## 2017-01-15 MED ORDER — FUROSEMIDE 20 MG PO TABS: 20.0000 mg | ORAL_TABLET | Freq: Every day | ORAL | 3 refills | Status: DC

## 2017-01-15 MED ORDER — FUROSEMIDE 20 MG PO TABS
20.0000 mg | ORAL_TABLET | Freq: Every day | ORAL | Status: DC
Start: 2017-01-15 — End: 2017-01-15

## 2017-01-15 MED ORDER — BENAZEPRIL HCL 20 MG PO TABS: 20.0000 mg | ORAL_TABLET | Freq: Every day | ORAL | 3 refills | Status: DC

## 2017-01-15 NOTE — Care Management Important Message (Signed)
Important Message  Patient Details  Name: KIRE FERG MRN: [id number] Date of Birth: November 06, 1922   Medicare Important Message Given:  Yes    Malcolm Metro, RN 01/15/2017, 12:27 PM

## 2017-01-15 NOTE — Care Management Note (Signed)
Case Management Note  Patient Details  Name: DAYNE CHAIT MRN: [id number] Date of Birth: March 27, 1923  Expected Discharge Date:  01/15/17               Expected Discharge Plan:  Home/Self Care  In-House Referral:  NA  Discharge planning Services  CM Consult  Post Acute Care Choice:  NA Choice offered to:  NA  Status of Service:  Completed, signed off  Additional Comments: Discharging home today with self care. Pt/dtr agreeable to DC plan. Dtr wants hospital bed for pt, he declines multiple requests. No CM needs noted at DC.   Malcolm Metro, RN 01/15/2017, 12:29 PM

## 2017-01-15 NOTE — Progress Notes (Signed)
Patient is to be discharged home and in stable condition. Patient's IV and telemetry removed, WNL. Patient given discharge instructions and verbalized understanding. Patient escorted out by staff via wheelchair.   Maesyn Frisinger P Dishmon, RN  

## 2017-01-15 NOTE — Discharge Summary (Signed)
Physician Discharge Summary  Danny [name] ZOX:[id number] DOB: 29-Aug-1922 DOA: 01/12/2017  PCP: Oval Linsey, MD  Admit date: 01/12/2017 Discharge date: 01/15/2017   Recommendations for Outpatient Follow-up:  Patient is advised to follow-up in my office in 5 days' time to take his ACE inhibitor 20 mg daily as well as Lasix 20 mg once a day in 5 days' time oh check his renal function and his electrolytes and make further recommendations as to his volume overload status Discharge Diagnoses:  Active Problems:   Acute exacerbation of CHF (congestive heart failure) (HCC)   Pressure injury of skin   Discharge Condition: Good  Filed Weights   01/12/17 1900 01/13/17 0522 01/14/17 0500  Weight: 64.2 kg (141 lb 8.6 oz) 63.6 kg (140 lb 3.4 oz) 62.4 kg (137 lb 9.1 oz)    History of present illness:  The patient is a 83 year old white male with severe aortic stenosis and moderate systolic and diastolic ventricular dysfunction and ejection fraction 35-40%. The patient is quite stoic and only takes lisinopril and ambulates he was admitted with increasing dyspnea orthopnea found to have a moderate to large left pleural effusion signs of pulmonary vascular congestion given aggressive intravenous diuresis the patient insisted on going home which got him to stay 72 hours and an adequate diuresis. It was felt that we can continue his diuresis as an outpatient his symptoms were relieved and was no longer orthopneic and dyspneic and his discharge medicines included denies Will 20 mg by mouth daily as well as Lasix 20 mg by mouth daily he'll follow-up in the office in 5 days' time to assess his renal function and electrolytes as well as by overload status the patient is a no code DO NOT RESUSCITAte  Hospital Course:  See history of present illness above Procedures:    Consultations:  Cardiology  Discharge Instructions  Discharge Instructions    Discharge instructions    Complete by:  As  directed    Discharge patient    Complete by:  As directed    Discharge disposition:  01-Home or Self Care   Discharge patient date:  01/15/2017      Allergies  Allergen Reactions  . Other Other (See Comments)    SKIN IS VERY THIN; TAPE WILL TEAR THE SKIN AND BRUISE IT!!  . Tape Other (See Comments)    SKIN IS VERY THIN; TAPE WILL TEAR THE SKIN AND BRUISE IT!!      The results of significant diagnostics from this hospitalization (including imaging, microbiology, ancillary and laboratory) are listed below for reference.    Significant Diagnostic Studies: Dg Chest 2 View  Result Date: 01/12/2017 CLINICAL DATA:  Shortness of breath and weakness. EXAM: CHEST  2 VIEW COMPARISON:  12/12/2014 FINDINGS: There is a left chest wall pacer device with lead in the right atrial appendage right ventricle peer stable cardiac enlargement. Moderate to large left pleural effusion and small right effusion. Diffuse pulmonary vascular congestion noted. IMPRESSION: 1. Suspect congestive heart failure. Electronically Signed   By: Signa Kell M.D.   On: 01/12/2017 13:21    Microbiology: No results found for this or any previous visit (from the past 240 hour(s)).   Labs: Basic Metabolic Panel:  Recent Labs Lab 01/12/17 1427 01/13/17 0559 01/14/17 0504 01/15/17 0449  NA 132* 133* 132* 132*  K 5.0 4.5 4.3 3.9  CL 102 103 98* 94*  CO2 20* GLUCOSE 148* 109* 111* 108*  BUN 58* 62* 62*  61*  CREATININE 2.18* 2.12* 2.03* 2.12*  CALCIUM 8.9 8.9 9.0 8.8*  MG  --  2.0 1.9  --    Liver Function Tests: No results for input(s): AST, ALT, ALKPHOS, BILITOT, PROT, ALBUMIN in the last 168 hours. No results for input(s): LIPASE, AMYLASE in the last 168 hours. No results for input(s): AMMONIA in the last 168 hours. CBC:  Recent Labs Lab 01/12/17 1427  WBC 6.3  NEUTROABS 4.5  HGB 13.4  HCT 39.6  MCV 95.4  PLT 161   Cardiac Enzymes:  Recent Labs Lab 01/12/17 1427 01/12/17 1919  01/13/17 0008 01/13/17 0559  TROPONINI 0.09* 0.18* 0.19* 0.20*   BNP: BNP (last 3 results) No results for input(s): BNP in the last 8760 hours.  ProBNP (last 3 results) No results for input(s): PROBNP in the last 8760 hours.  CBG: No results for input(s): GLUCAP in the last 168 hours.     Signed:  Myla Mauriello Judie Petit  [facility] Pager: [phone] 01/15/2017, 12:01 PM

## 2017-01-24 ENCOUNTER — Encounter (HOSPITAL_COMMUNITY): Payer: Self-pay | Admitting: Emergency Medicine

## 2017-01-24 ENCOUNTER — Inpatient Hospital Stay (HOSPITAL_COMMUNITY)
Admission: EM | Admit: 2017-01-24 | Discharge: 2017-01-29 | DRG: 291 | Disposition: A | Discharge status: Home / Self Care | Payer: Medicare Other | Attending: Family Medicine | Admitting: Family Medicine

## 2017-01-24 ENCOUNTER — Emergency Department (HOSPITAL_COMMUNITY): Payer: Medicare Other

## 2017-01-24 DIAGNOSIS — I10 Essential (primary) hypertension: Secondary | ICD-10-CM

## 2017-01-24 DIAGNOSIS — R778 Other specified abnormalities of plasma proteins: Secondary | ICD-10-CM | POA: Diagnosis present

## 2017-01-24 DIAGNOSIS — N170 Acute kidney failure with tubular necrosis: Secondary | ICD-10-CM

## 2017-01-24 DIAGNOSIS — N184 Chronic kidney disease, stage 4 (severe): Secondary | ICD-10-CM | POA: Diagnosis present

## 2017-01-24 DIAGNOSIS — Z91048 Other nonmedicinal substance allergy status: Secondary | ICD-10-CM | POA: Diagnosis not present

## 2017-01-24 DIAGNOSIS — N179 Acute kidney failure, unspecified: Secondary | ICD-10-CM | POA: Diagnosis present

## 2017-01-24 DIAGNOSIS — E43 Unspecified severe protein-calorie malnutrition: Secondary | ICD-10-CM | POA: Diagnosis present

## 2017-01-24 DIAGNOSIS — R0601 Orthopnea: Secondary | ICD-10-CM | POA: Diagnosis present

## 2017-01-24 DIAGNOSIS — Z79899 Other long term (current) drug therapy: Secondary | ICD-10-CM | POA: Diagnosis not present

## 2017-01-24 DIAGNOSIS — I130 Hypertensive heart and chronic kidney disease with heart failure and stage 1 through stage 4 chronic kidney disease, or unspecified chronic kidney disease: Principal | ICD-10-CM | POA: Diagnosis present

## 2017-01-24 DIAGNOSIS — R7989 Other specified abnormal findings of blood chemistry: Secondary | ICD-10-CM | POA: Diagnosis present

## 2017-01-24 DIAGNOSIS — Z66 Do not resuscitate: Secondary | ICD-10-CM | POA: Diagnosis present

## 2017-01-24 DIAGNOSIS — I441 Atrioventricular block, second degree: Secondary | ICD-10-CM

## 2017-01-24 DIAGNOSIS — R748 Abnormal levels of other serum enzymes: Secondary | ICD-10-CM | POA: Diagnosis present

## 2017-01-24 DIAGNOSIS — R0602 Shortness of breath: Secondary | ICD-10-CM

## 2017-01-24 DIAGNOSIS — Z6820 Body mass index (BMI) 20.0-20.9, adult: Secondary | ICD-10-CM | POA: Diagnosis not present

## 2017-01-24 DIAGNOSIS — I5043 Acute on chronic combined systolic (congestive) and diastolic (congestive) heart failure: Secondary | ICD-10-CM | POA: Diagnosis present

## 2017-01-24 DIAGNOSIS — N289 Disorder of kidney and ureter, unspecified: Secondary | ICD-10-CM

## 2017-01-24 LAB — COMPREHENSIVE METABOLIC PANEL
ALBUMIN: 3.5 g/dL (ref 3.5–5.0)
ALK PHOS: 88 U/L (ref 38–126)
ALT: 22 U/L (ref 17–63)
ANION GAP: 15 (ref 5–15)
AST: 29 U/L (ref 15–41)
BUN: 118 mg/dL — ABNORMAL HIGH (ref 6–20)
CHLORIDE: 102 mmol/L (ref 101–111)
CO2: 24 mmol/L (ref 22–32)
Calcium: 9.7 mg/dL (ref 8.9–10.3)
Creatinine, Ser: 2.94 mg/dL — ABNORMAL HIGH (ref 0.61–1.24)
GFR calc non Af Amer: 17 mL/min — ABNORMAL LOW (ref >60)
GFR, EST AFRICAN AMERICAN: 20 mL/min — AB (ref >60)
GLUCOSE: 122 mg/dL — AB (ref 65–99)
Potassium: 4.8 mmol/L (ref 3.5–5.1)
SODIUM: 141 mmol/L (ref 135–145)
Total Bilirubin: 1.4 mg/dL — ABNORMAL HIGH (ref 0.3–1.2)
Total Protein: 8.7 g/dL — ABNORMAL HIGH (ref 6.5–8.1)

## 2017-01-24 LAB — BRAIN NATRIURETIC PEPTIDE: B Natriuretic Peptide: >4500 pg/mL — ABNORMAL HIGH (ref 0.0–100.0)

## 2017-01-24 LAB — CBC WITH DIFFERENTIAL/PLATELET
Basophils Absolute: 0 10*3/uL (ref 0.0–0.1)
Basophils Relative: 0 %
EOS ABS: 0 10*3/uL (ref 0.0–0.7)
Eosinophils Relative: 0 %
HEMATOCRIT: 44.4 % (ref 39.0–52.0)
HEMOGLOBIN: 15.1 g/dL (ref 13.0–17.0)
LYMPHS ABS: 1 10*3/uL (ref 0.7–4.0)
LYMPHS PCT: 8 %
MCH: 32.8 pg (ref 26.0–34.0)
MCHC: 34 g/dL (ref 30.0–36.0)
MCV: 96.3 fL (ref 78.0–100.0)
MONOS PCT: 10 %
Monocytes Absolute: 1.3 10*3/uL — ABNORMAL HIGH (ref 0.1–1.0)
NEUTROS ABS: 11 10*3/uL — AB (ref 1.7–7.7)
NEUTROS PCT: 82 %
Platelets: 124 10*3/uL — ABNORMAL LOW (ref 150–400)
RBC: 4.61 MIL/uL (ref 4.22–5.81)
RDW: 17.9 % — ABNORMAL HIGH (ref 11.5–15.5)
WBC: 13.4 10*3/uL — AB (ref 4.0–10.5)

## 2017-01-24 LAB — TROPONIN I: TROPONIN I: 0.07 ng/mL — AB (ref <0.03)

## 2017-01-24 MED ORDER — SODIUM CHLORIDE 0.9 % IV SOLN: 250.0000 mL | INTRAVENOUS | Status: DC | PRN

## 2017-01-24 MED ORDER — FUROSEMIDE 10 MG/ML IJ SOLN
40.0000 mg | Freq: Two times a day (BID) | INTRAMUSCULAR | Status: DC
  Administered 2017-01-25 – 2017-01-29 (×9): 40 mg via INTRAVENOUS
  Filled 2017-01-24 (×9): qty 4

## 2017-01-24 MED ORDER — SODIUM CHLORIDE 0.9% FLUSH
3.0000 mL | Freq: Two times a day (BID) | INTRAVENOUS | Status: DC
  Administered 2017-01-24 – 2017-01-29 (×10): 3 mL via INTRAVENOUS

## 2017-01-24 MED ORDER — FUROSEMIDE 10 MG/ML IJ SOLN
40.0000 mg | Freq: Once | INTRAMUSCULAR | Status: AC
  Administered 2017-01-24: 40 mg via INTRAVENOUS
  Filled 2017-01-24: qty 4

## 2017-01-24 MED ORDER — HYDRALAZINE HCL 20 MG/ML IJ SOLN: 10.0000 mg | INTRAMUSCULAR | Status: DC | PRN

## 2017-01-24 MED ORDER — SODIUM CHLORIDE 0.9% FLUSH
3.0000 mL | INTRAVENOUS | Status: DC | PRN
  Administered 2017-01-25: 3 mL via INTRAVENOUS
  Filled 2017-01-24: qty 3

## 2017-01-24 MED ORDER — ACETAMINOPHEN 325 MG PO TABS: 650.0000 mg | ORAL_TABLET | ORAL | Status: DC | PRN

## 2017-01-24 MED ORDER — ALPRAZOLAM 0.25 MG PO TABS: 0.2500 mg | ORAL_TABLET | Freq: Two times a day (BID) | ORAL | Status: DC | PRN

## 2017-01-24 MED ORDER — ONDANSETRON HCL 4 MG/2ML IJ SOLN: 4.0000 mg | Freq: Four times a day (QID) | INTRAMUSCULAR | Status: DC | PRN

## 2017-01-24 MED ORDER — HEPARIN SODIUM (PORCINE) 5000 UNIT/ML IJ SOLN
5000.0000 [IU] | Freq: Three times a day (TID) | INTRAMUSCULAR | Status: DC
  Administered 2017-01-24 – 2017-01-29 (×12): 5000 [IU] via SUBCUTANEOUS
  Filled 2017-01-24 (×15): qty 1

## 2017-01-24 NOTE — ED Notes (Signed)
[facility] arrived

## 2017-01-24 NOTE — ED Provider Notes (Signed)
[facility] DEPT Provider Note   CSN: [id number] Arrival date & time: 01/24/17  2006     History   Chief Complaint Chief Complaint  Patient presents with  . Shortness of Breath    HPI Danny [name] is a 83 y.o. male.  Patient complains of shortness of breath. He has a history of aortic stenosis and was recently in the hospital for heart failure   The history is provided by the patient. No language interpreter was used.  Shortness of Breath  This is a recurrent problem. The problem occurs continuously.The current episode started 12 to 24 hours ago. The problem has not changed since onset.Pertinent negatives include no fever, no headaches, no cough, no chest pain, no abdominal pain and no rash. It is unknown what precipitated the problem. Risk factors: Nothing. He has tried nothing for the symptoms. The treatment provided no relief. He has had prior hospitalizations. He has had prior ED visits. He has had prior ICU admissions. Associated medical issues do not include pneumonia.    Past Medical History:  Diagnosis Date  . Esophageal stricture    dilatation remotely no recurrent problems  . Hypertension   . Second degree Mobitz II AV block   . Severe aortic stenosis     Patient Active Problem List   Diagnosis Date Noted  . Pressure injury of skin 01/13/2017  . Acute exacerbation of CHF (congestive heart failure) (HCC) 01/12/2017  . Second degree heart block 12/10/2014  . Complete heart block (HCC) 12/10/2014  . Symptomatic bradycardia 12/10/2014  . RBBB 12/10/2014  . Essential hypertension 12/10/2014  . Hyperglycemia 12/10/2014  . Renal insufficiency 12/10/2014    Past Surgical History:  Procedure Laterality Date  . APPENDECTOMY    . EP IMPLANTABLE DEVICE N/A 12/11/2014   SJM Assurity DR implanted by Dr Johney Frame for mobitz II second degree AV block  . PACEMAKER IMPLANT    . TONSILLECTOMY     remote       Home Medications    Prior to Admission medications    Medication Sig Start Date End Date Taking? Authorizing Provider  benazepril (LOTENSIN) 20 MG tablet Take 1 tablet (20 mg total) by mouth daily. 01/15/17  Yes Dondiego, Gerlene Burdock, MD  furosemide (LASIX) 20 MG tablet Take 1 tablet (20 mg total) by mouth daily. 01/15/17  Yes Oval Linsey, MD    Family History Family History  Problem Relation Age of Onset  . Hypertension Mother   . Heart disease Father   . Heart disease Brother        D/C 1996  . Other Sister        DROWNED AT 35  . Other Sister        HEALTHY AGE 26  . Heart disease Sister        PACER AGE 67  . CAD Neg Hx     Social History Social History  Substance Use Topics  . Smoking status: Never Smoker  . Smokeless tobacco: Never Used  . Alcohol use No     Allergies   Other and Tape   Review of Systems Review of Systems  Constitutional: Negative for appetite change, fatigue and fever.  HENT: Negative for congestion, ear discharge and sinus pressure.   Eyes: Negative for discharge.  Respiratory: Positive for shortness of breath. Negative for cough.   Cardiovascular: Negative for chest pain.  Gastrointestinal: Negative for abdominal pain and diarrhea.  Genitourinary: Negative for frequency and hematuria.  Musculoskeletal: Negative for back pain.  Skin: Negative for rash.  Neurological: Negative for seizures and headaches.  Psychiatric/Behavioral: Negative for hallucinations.     Physical Exam Updated Vital Signs BP (!) 104/58   Pulse 74   Resp 15   Ht  (1.702 m)   Wt 62.1 kg (137 lb)   SpO2 100%   BMI 21.46 kg/m   Physical Exam  Constitutional: He is oriented to person, place, and time. He appears well-developed.  HENT:  Head: Normocephalic.  Eyes: Conjunctivae and EOM are normal. No scleral icterus.  Neck: Neck supple. No thyromegaly present.  Cardiovascular: Normal rate and regular rhythm.  Exam reveals no gallop and no friction rub.   No murmur heard. Pulmonary/Chest: No stridor. He  has no wheezes. He has no rales. He exhibits no tenderness.  Abdominal: He exhibits no distension. There is no tenderness. There is no rebound.  Musculoskeletal: Normal range of motion. He exhibits no edema.  Lymphadenopathy:    He has no cervical adenopathy.  Neurological: He is oriented to person, place, and time. He exhibits normal muscle tone. Coordination normal.  Skin: No rash noted. No erythema.  Psychiatric: He has a normal mood and affect. His behavior is normal.     ED Treatments / Results  Labs (all labs ordered are listed, but only abnormal results are displayed) Labs Reviewed  CBC WITH DIFFERENTIAL/PLATELET - Abnormal; Notable for the following:       Result Value   WBC 13.4 (*)    RDW 17.9 (*)    Platelets 124 (*)    Neutro Abs 11.0 (*)    Monocytes Absolute 1.3 (*)    All other components within normal limits  BRAIN NATRIURETIC PEPTIDE - Abnormal; Notable for the following:    B Natriuretic Peptide >4,500.0 (*)    All other components within normal limits  TROPONIN I - Abnormal; Notable for the following:    Troponin I 0.07 (*)    All other components within normal limits  COMPREHENSIVE METABOLIC PANEL - Abnormal; Notable for the following:    Glucose, Bld 122 (*)    Creatinine, Ser 2.94 (*)    Total Protein 8.7 (*)    Total Bilirubin 1.4 (*)    GFR calc non Af Amer 17 (*)    GFR calc Af Amer 20 (*)    All other components within normal limits    EKG  EKG Interpretation  Date/Time:  Sunday January 24 2017 20:16:00 EDT Ventricular Rate:  62 PR Interval:    QRS Duration: 149 QT Interval:  444 QTC Calculation: 532 R Axis:   -101 Text Interpretation:  Ventricular-paced complexes No further analysis attempted due to paced rhythm Baseline wander in lead(s) V4 Confirmed by Bethann Berkshire [phone]) on 01/24/2017 8:23:16 PM       Radiology Dg Chest Portable 1 View  Result Date: 01/24/2017 CLINICAL DATA:  Worsening dyspnea over the past 2 days. EXAM:  PORTABLE CHEST 1 VIEW COMPARISON:  01/12/2017 FINDINGS: Moderately large left pleural effusion, probably unchanged. Small right pleural effusion, enlarged. Marked cardiomegaly, unchanged. Mild vascular and interstitial prominence, slightly worsened. IMPRESSION: The findings likely represent congestive heart failure with interstitial edema and pleural effusions. This is mildly worsened from 01/12/2017. Electronically Signed   By: Ellery Plunk M.D.   On: 01/24/2017 20:36    Procedures Procedures (including critical care time)  Medications Ordered in ED Medications  furosemide (LASIX) injection 40 mg (40 mg Intravenous Given 01/24/17 2148)     Initial Impression / Assessment and  Plan / ED Course  I have reviewed the triage vital signs and the nursing notes.  Pertinent labs & imaging results that were available during my care of the patient were reviewed by me and considered in my medical decision making (see chart for details).     Patient with congestive heart failure. He will be admitted to medicine and diuresed Final Clinical Impressions(s) / ED Diagnoses   Final diagnoses:  SOB (shortness of breath)    New Prescriptions New Prescriptions   No medications on file     Bethann Berkshire, MD 01/24/17 2224

## 2017-01-24 NOTE — ED Triage Notes (Signed)
SOB that has gotten worse in the last two days.  Was admitted last week for fluid on the lungs.

## 2017-01-24 NOTE — ED Notes (Signed)
CRITICAL VALUE ALERT  Critical Value:  Troponin 0.07  Date & Time Notied:  01/24/17 2216  Provider Notified: Estell Harpin MD  Orders Received/Actions taken: None

## 2017-01-24 NOTE — H&P (Signed)
History and Physical    Danny [name] Life ZOX:[id number] DOB: [birth year] DOA: 01/24/2017  PCP: Oval Linsey, MD   Patient coming from: Home  Chief Complaint: SOB   HPI: Danny [name] is a 83 y.o. male with medical history significant for hypertension, heart block status post pacer placement, and chronic combined systolic/diastolic CHF, now presenting to the emergency department with progressive dyspnea. Patient had been admitted to the hospital just over a week ago with similar complaints and was found to be and acute CHF at that time. He was diuresed and discharged in much improved and stable condition, did well initially back at home, but now has recurrence in his dyspnea over the past 2-3 days. No chest pain, no fevers, no chills.denies missing his medications or dietary indiscretions.  ED Course: Upon arrival to the ED, patient is found to be saturating adequately on room air, slightly tachypneic, and with vitals otherwise stable. EKG features a paced rhythm. Chest x-ray was notable for interstitial edema and pleural effusions, mildly worse than at time of the prior admission. Chemistry panels notable for a creatinine of 2.94, up from apparent baseline of 2.0. CBC features a leukocytosis to 13,400 and platelets are low at 124,000. BNP is undetectable he high and troponin is mildly elevated to 0.07. The patient was treated with 40 mg of IV Lasix in the ED. He has remained hemodynamically stable, is not in acute distress, and will be admitted to the telemetry unit for ongoing evaluation and management of dyspnea secondary to acute on chronic combined CHF.  Review of Systems:  All other systems reviewed and apart from HPI, are negative.  Past Medical History:  Diagnosis Date  . Esophageal stricture    dilatation remotely no recurrent problems  . Hypertension   . Second degree Mobitz II AV block   . Severe aortic stenosis     Past Surgical History:  Procedure Laterality Date  .  APPENDECTOMY    . EP IMPLANTABLE DEVICE N/A 12/11/2014   SJM Assurity DR implanted by Dr Johney Frame for mobitz II second degree AV block  . PACEMAKER IMPLANT    . TONSILLECTOMY     remote     reports that he has never smoked. He has never used smokeless tobacco. He reports that he does not drink alcohol or use drugs.  Allergies  Allergen Reactions  . Other Other (See Comments)    SKIN IS VERY THIN; TAPE WILL TEAR THE SKIN AND BRUISE IT!!  . Tape Other (See Comments)    SKIN IS VERY THIN; TAPE WILL TEAR THE SKIN AND BRUISE IT!!    Family History  Problem Relation Age of Onset  . Hypertension Mother   . Heart disease Father   . Heart disease Brother        D/C 1996  . Other Sister        DROWNED AT 47  . Other Sister        HEALTHY AGE 95  . Heart disease Sister        PACER AGE 55  . CAD Neg Hx      Prior to Admission medications   Medication Sig Start Date End Date Taking? Authorizing Provider  benazepril (LOTENSIN) 20 MG tablet Take 1 tablet (20 mg total) by mouth daily. 01/15/17  Yes Dondiego, Gerlene Burdock, MD  furosemide (LASIX) 20 MG tablet Take 1 tablet (20 mg total) by mouth daily. 01/15/17  Yes Oval Linsey, MD    Physical Exam: Vitals:  01/24/17 2100 01/24/17 2115 01/24/17 2130 01/24/17 2132  BP: 96/66  (!) 104/58   Pulse: 62 64 61 74  Resp: (!) 22 (!) 21 15   SpO2: 100% 100% 100%   Weight:      Height:          Constitutional: NAD, calm, cachectic Eyes: PERTLA, lids and conjunctivae normal ENMT: Mucous membranes are moist. Posterior pharynx clear of any exudate or lesions.   Neck: normal, supple, no masses, no thyromegaly Respiratory: Fine rales bilaterally. Mild tachypnea, dyspnea with speech. No accessory muscle use.  Cardiovascular: S1 & S2 heard, regular rate and rhythm. No extremity edema. JVP 8 cmH2O. Abdomen: No distension, no tenderness, no masses palpated. Bowel sounds normal.  Musculoskeletal: no clubbing / cyanosis. No joint deformity upper  and lower extremities.  Skin: no significant rashes, lesions, ulcers. Warm, dry, well-perfused. Neurologic: CN 2-12 grossly intact. Sensation intact. Strength 5/5 in all 4 limbs.  Psychiatric: Alert and oriented x 3. Calm, cooperative.     Labs on Admission: I have personally reviewed following labs and imaging studies  CBC:  Recent Labs Lab 01/24/17 2020  WBC 13.4*  NEUTROABS 11.0*  HGB 15.1  HCT 44.4  MCV 96.3  PLT 124*   Basic Metabolic Panel:  Recent Labs Lab 01/24/17 2115  NA 141  K 4.8  CL 102  CO2 24  GLUCOSE 122*  BUN PENDING  CREATININE 2.94*  CALCIUM 9.7   GFR: Estimated Creatinine Clearance: 14.4 mL/min (A) (by C-G formula based on SCr of 2.94 mg/dL (H)). Liver Function Tests:  Recent Labs Lab 01/24/17 2115  AST 29  ALT 22  ALKPHOS 88  BILITOT 1.4*  PROT 8.7*  ALBUMIN 3.5   No results for input(s): LIPASE, AMYLASE in the last 168 hours. No results for input(s): AMMONIA in the last 168 hours. Coagulation Profile: No results for input(s): INR, PROTIME in the last 168 hours. Cardiac Enzymes:  Recent Labs Lab 01/24/17 2115  TROPONINI 0.07*   BNP (last 3 results) No results for input(s): PROBNP in the last 8760 hours. HbA1C: No results for input(s): HGBA1C in the last 72 hours. CBG: No results for input(s): GLUCAP in the last 168 hours. Lipid Profile: No results for input(s): CHOL, HDL, LDLCALC, TRIG, CHOLHDL, LDLDIRECT in the last 72 hours. Thyroid Function Tests: No results for input(s): TSH, T4TOTAL, FREET4, T3FREE, THYROIDAB in the last 72 hours. Anemia Panel: No results for input(s): VITAMINB12, FOLATE, FERRITIN, TIBC, IRON, RETICCTPCT in the last 72 hours. Urine analysis:    Component Value Date/Time   COLORURINE YELLOW 12/10/2014 1740   APPEARANCEUR CLEAR 12/10/2014 1740   LABSPEC 1.020 12/10/2014 1740   PHURINE 6.0 12/10/2014 1740   GLUCOSEU NEGATIVE 12/10/2014 1740   HGBUR NEGATIVE 12/10/2014 1740   BILIRUBINUR NEGATIVE  12/10/2014 1740   KETONESUR NEGATIVE 12/10/2014 1740   PROTEINUR NEGATIVE 12/10/2014 1740   UROBILINOGEN 0.2 12/10/2014 1740   NITRITE NEGATIVE 12/10/2014 1740   LEUKOCYTESUR NEGATIVE 12/10/2014 1740   Sepsis Labs: (procalcitonin:4,lacticidven:4) )No results found for this or any previous visit (from the past 240 hour(s)).   Radiological Exams on Admission: Dg Chest Portable 1 View  Result Date: 01/24/2017 CLINICAL DATA:  Worsening dyspnea over the past 2 days. EXAM: PORTABLE CHEST 1 VIEW COMPARISON:  01/12/2017 FINDINGS: Moderately large left pleural effusion, probably unchanged. Small right pleural effusion, enlarged. Marked cardiomegaly, unchanged. Mild vascular and interstitial prominence, slightly worsened. IMPRESSION: The findings likely represent congestive heart failure with interstitial edema and pleural effusions. This  is mildly worsened from 01/12/2017. Electronically Signed   By: Ellery Plunk M.D.   On: 01/24/2017 20:36    EKG: Independently reviewed. Ventricular-paced rhythm.   Assessment/Plan  1. Acute on chronic combined CHF  - Pt presents with worsening dyspnea, found to be in acute CHF with interstitial edema and BNP beyond the limits of quantification  - Treated with Lasix 40 mg IV in ED  - Plan to continue cardiac monitoring, SLIV, fluid-restrict diet, follow I/O's and daily wts, continue diuresis with Lasix 40 mg IV q12h, and follow daily chem panel  - Hold ACE until renal function stabilizes    2. Acute kidney injury superimposed on CKD stage III  - SCr is 2.94 on admission, up from apparent baseline of 2  - Likely secondary to acute decompensated CHF   - Continue diuresis as above, hold benazepril, follow daily chem panel    3. Elevated troponin  - Troponin elevated to 0.07 in setting of acute CHF and AKI on CKD IV  - No anginal complaints  - Continue telemetry monitoring and trend troponin  - Echo ordered as above    4. Hypertension  - BP  is at goal  - Hold benazepril until renal function stabilized - Use hydralazine IVP's prn for now    DVT prophylaxis: sq heparin  Code Status: DNR Family Communication: Daughter updated at bedside Disposition Plan: Admit to telemetry Consults called: None Admission status: Inpatient    Briscoe Deutscher, MD [facility] Pager [phone]  If 7PM-7AM, please contact night-coverage [email or web address] Password [email or web address]  01/24/2017, 10:29 PM

## 2017-01-25 ENCOUNTER — Inpatient Hospital Stay (HOSPITAL_COMMUNITY): Payer: Medicare Other

## 2017-01-25 LAB — BASIC METABOLIC PANEL
Anion gap: 11 (ref 5–15)
BUN: 116 mg/dL — AB (ref 6–20)
CALCIUM: 8.9 mg/dL (ref 8.9–10.3)
CO2: 23 mmol/L (ref 22–32)
CREATININE: 2.95 mg/dL — AB (ref 0.61–1.24)
Chloride: 103 mmol/L (ref 101–111)
GFR calc non Af Amer: 17 mL/min — ABNORMAL LOW (ref >60)
GFR, EST AFRICAN AMERICAN: 20 mL/min — AB (ref >60)
Glucose, Bld: 134 mg/dL — ABNORMAL HIGH (ref 65–99)
Potassium: 4.9 mmol/L (ref 3.5–5.1)
SODIUM: 137 mmol/L (ref 135–145)

## 2017-01-25 LAB — TROPONIN I
TROPONIN I: 0.06 ng/mL — AB (ref <0.03)
TROPONIN I: 0.06 ng/mL — AB (ref <0.03)

## 2017-01-25 MED ORDER — MORPHINE SULFATE (PF) 2 MG/ML IV SOLN
1.0000 mg | INTRAVENOUS | Status: DC | PRN
  Administered 2017-01-26 (×2): 1 mg via INTRAVENOUS
  Filled 2017-01-25 (×2): qty 1

## 2017-01-25 MED ORDER — IPRATROPIUM-ALBUTEROL 0.5-2.5 (3) MG/3ML IN SOLN
3.0000 mL | Freq: Three times a day (TID) | RESPIRATORY_TRACT | Status: DC
  Administered 2017-01-25 – 2017-01-29 (×11): 3 mL via RESPIRATORY_TRACT
  Filled 2017-01-25 (×11): qty 3

## 2017-01-25 MED ORDER — IPRATROPIUM-ALBUTEROL 0.5-2.5 (3) MG/3ML IN SOLN
3.0000 mL | Freq: Four times a day (QID) | RESPIRATORY_TRACT | Status: DC
  Administered 2017-01-25: 3 mL via RESPIRATORY_TRACT
  Filled 2017-01-25: qty 3

## 2017-01-25 NOTE — Progress Notes (Addendum)
Patient with recurrent end-stage valvular disease with severe aortic stenosis combined with systolic dysfunction EF of 30% based on echo of 1217. Patient recently admitted diuresis to improve sent home and is now returned with increasing dyspnea elevated BNP for conservative comfort care measures. He likewise has acute on chronic renal insufficiency with a creatinine of 2.75 will diuresis monitor renal function and hold any other antihypertensive which may affect renal perfusion. He is placed on Lasix 40 mg IV twice a day we will also add nebulizer therapy with DuoNeb 4 times a day CAID RADIN ONG:[id number] DOB: 1922-05-01 DOA: 01/24/2017 PCP: Oval Linsey, MD   Physical Exam: Blood pressure 108/71, pulse 71, temperature 98 F (36.7 C), temperature source Oral, resp. rate 18, height  (1.702 m), weight 62.2 kg (137 lb 2 oz), SpO2 98 %. Lungs diminished breath sounds in the bases mild rales audible bilaterally prolonged history phase scattered coarse rhonchi no wheezes audible heart regular rhythm 2/6 aortic flow murmur S3 positive no heaves thrills or rubs extremities 1+ chronic pitting edema   Investigations:  No results found for this or any previous visit (from the past 240 hour(s)).   Basic Metabolic Panel:  Recent Labs  44/01/02 2115 01/25/17 0455  NA 141 137  K 4.8 4.9  CL 102 103  CO2 24 23  GLUCOSE 122* 134*  BUN 118* 116*  CREATININE 2.94* 2.95*  CALCIUM 9.7 8.9   Liver Function Tests:  Recent Labs  01/24/17 2115  AST 29  ALT 22  ALKPHOS 88  BILITOT 1.4*  PROT 8.7*  ALBUMIN 3.5     CBC:  Recent Labs  01/24/17 2020  WBC 13.4*  NEUTROABS 11.0*  HGB 15.1  HCT 44.4  MCV 96.3  PLT 124*    Dg Chest Portable 1 View  Result Date: 01/24/2017 CLINICAL DATA:  Worsening dyspnea over the past 2 days. EXAM: PORTABLE CHEST 1 VIEW COMPARISON:  01/12/2017 FINDINGS: Moderately large left pleural effusion, probably unchanged. Small right pleural effusion,  enlarged. Marked cardiomegaly, unchanged. Mild vascular and interstitial prominence, slightly worsened. IMPRESSION: The findings likely represent congestive heart failure with interstitial edema and pleural effusions. This is mildly worsened from 01/12/2017. Electronically Signed   By: Ellery Plunk M.D.   On: 01/24/2017 20:36      Medications:   Impression:  Principal Problem:   Acute on chronic combined systolic and diastolic CHF (congestive heart failure) (HCC) Active Problems:   Essential hypertension   Acute renal failure superimposed on stage 4 chronic kidney disease (HCC)   Elevated troponin     Plan: Lasix 40 mg IV twice a day. DuoNeb nebulizer 4 times a day hold ace inhibitor at present due to altered renal perfusion and renal insufficiency. We'll add morphine sulfate 1 mg IV every 2 hours when necessary for dyspnea   Consultants:     Procedures   Antibiotics:            Time spent: 30 minutes   LOS: 1 day   Raghad Lorenz M   01/25/2017, 12:43 PM

## 2017-01-26 LAB — BASIC METABOLIC PANEL
ANION GAP: 13 (ref 5–15)
BUN: 120 mg/dL — ABNORMAL HIGH (ref 6–20)
CHLORIDE: 100 mmol/L — AB (ref 101–111)
CO2: 26 mmol/L (ref 22–32)
Calcium: 9 mg/dL (ref 8.9–10.3)
Creatinine, Ser: 3.01 mg/dL — ABNORMAL HIGH (ref 0.61–1.24)
GFR calc Af Amer: 19 mL/min — ABNORMAL LOW (ref >60)
GFR calc non Af Amer: 17 mL/min — ABNORMAL LOW (ref >60)
GLUCOSE: 125 mg/dL — AB (ref 65–99)
POTASSIUM: 4.8 mmol/L (ref 3.5–5.1)
Sodium: 139 mmol/L (ref 135–145)

## 2017-01-26 MED ORDER — ENSURE ENLIVE PO LIQD
237.0000 mL | Freq: Three times a day (TID) | ORAL | Status: DC
  Administered 2017-01-26 – 2017-01-29 (×5): 237 mL via ORAL

## 2017-01-26 NOTE — Plan of Care (Signed)
Problem: Safety: Goal: Ability to remain free from injury will improve Outcome: Progressing Pt is a High Fall risk d/t two prior falls in the past 6 months. Pts bed in lowest locked position, SR up x3, call bell in reach. Pt adv to call if needing to get OOB to BR. Condom catheter placed per pt request. Pt educated on safety/falls precautions. Pt verbalized understanding. Will continue to monitor pt

## 2017-01-26 NOTE — Care Management Note (Signed)
Case Management Note  Patient Details  Name: Danny [name] MRN: [id number] Date of Birth: 07/31/1922  Subjective/Objective:   Admitted with CHF. Pt is from home, lives alone, he has son and dtr that live across the street. He is ind with ADL's. He uses no DME . He has PCP, drives himself to appointments. He has insurance with drug coverage and no difficulty affording or managing medications.  He communicates no needs or concerns about DC plan.                  Action/Plan: Anticipate DC home, CM following for needs.    Expected Discharge Date:      01/28/2017            Expected Discharge Plan:  Home/Self Care  In-House Referral:     Discharge planning Services  CM Consult  Post Acute Care Choice:    Choice offered to:     DME Arranged:    DME Agency:     HH Arranged:    HH Agency:     Status of Service:  In process, will continue to follow  If discussed at Long Length of Stay Meetings, dates discussed:    Additional Comments:  Ralph Benavidez, Chrystine Oiler, RN 01/26/2017, 10:38 AM

## 2017-01-26 NOTE — Progress Notes (Signed)
Patient is a little less dyspneic. Today less orthopnea. Alert receiving MS 04 for dyspnea every 2 hours [facility] Malena Inscoe JJK:[id number] DOB: 11-Jun-1922 DOA: 01/24/2017 PCP: Lucia Gaskins, MD   Physical Exam: Blood pressure 110/61, pulse 70, temperature 98 F (36.7 C), temperature source Oral, resp. rate 16, height _0  (1.702 m), weight 62.1 kg (136 lb 14.5 oz), SpO2 98 %. Lungs diminished breath sounds in both bases no rales audible. No wheeze scattered rhonchi bilaterally. heart regular rhythm no S3 to over 6 aortic outflow murmur noted   Investigations:  No results found for this or any previous visit (from the past 240 hour(s)).   Basic Metabolic Panel:  Recent Labs  01/25/17 0455 01/26/17 0427  NA 137 139  K 4.9 4.8  CL 103 100*  CO2 23 26  GLUCOSE 134* 125*  BUN 116* 120*  CREATININE 2.95* 3.01*  CALCIUM 8.9 9.0   Liver Function Tests:  Recent Labs  01/24/17 2115  AST 29  ALT 22  ALKPHOS 88  BILITOT 1.4*  PROT 8.7*  ALBUMIN 3.5     CBC:  Recent Labs  01/24/17 2020  WBC 13.4*  NEUTROABS 11.0*  HGB 15.1  HCT 44.4  MCV 96.3  PLT 124*    Dg Chest Portable 1 View  Result Date: 01/24/2017 CLINICAL DATA:  Worsening dyspnea over the past 2 days. EXAM: PORTABLE CHEST 1 VIEW COMPARISON:  01/12/2017 FINDINGS: Moderately large left pleural effusion, probably unchanged. Small right pleural effusion, enlarged. Marked cardiomegaly, unchanged. Mild vascular and interstitial prominence, slightly worsened. IMPRESSION: The findings likely represent congestive heart failure with interstitial edema and pleural effusions. This is mildly worsened from 01/12/2017. Electronically Signed   By: Andreas Newport M.D.   On: 01/24/2017 20:36      Medications:   Impression:  Principal Problem:   Acute on chronic combined systolic and diastolic CHF (congestive heart failure) (HCC) Active Problems:   Essential hypertension   Acute renal failure superimposed on stage  4 chronic kidney disease (HCC)   Elevated troponin     Plan: Continue Lasix IV 40 every 12 hours continue MS 041 mg IV every 2 hours for dyspnea monitor renal function would be met daily continue DuoNeb nebulizer 4 times a day   Consultants:   Procedures   Antibiotics:          Time spent: 30 minutes   LOS: 2 days   Finch Costanzo M   01/26/2017, 1:26 PM

## 2017-01-26 NOTE — Progress Notes (Signed)
Initial Nutrition Assessment  DOCUMENTATION CODES:   Severe malnutrition in context of chronic illness  INTERVENTION:  Ensure Enlive po TID, each supplement provides 350 kcal and 20 grams of protein    NUTRITION DIAGNOSIS:   Malnutrition (Severe) related to  (CHF, Severe aortic stenosis) as evidenced by energy intake < or equal to 75% for > or equal to 1 month, percent weight loss (>5% x 1 month and 20% over the past year).  GOAL:   Patient will meet greater than or equal to 90% of their needs  MONITOR:   Supplement acceptance, PO intake, Weight trends, Labs  REASON FOR ASSESSMENT:   Malnutrition Screening Tool    ASSESSMENT:  Danny [name] is a pleasant 83 yo male with hx of severe aorotic stenosis, s/p pacemaker placement. He presents with recurrent acute on chronic CHF and acute kidney injury on CKD-3.   Diet hx: his daughter Sedalia Muta helps with preparing his meals. For several weeks his appetite has been poor and she says sometimes he thinks he has eaten or drank and Ensure recently but gets his time frame confused. He feeds himself but is only eating 0-25% of meals. His other family says he eats a few bites of pineapple and some chicken noodle soup. The soup has been a recent favorite. This is typically a high sodium food but he is only eating a small bowl and little else.  He likes Ensure [employer] and is drinking 1-2 per day.   Weight hx: loss of 11% in less than 1 month and 20% since last year same time frame.   Nutrition-Focused physical exam completed. Findings are moderate fat depletion (upper arms and orbital regions), severe muscle depletion (clavicles, acromion, scapular) and no edema.       Recent Labs Lab 01/24/17 2115 01/25/17 0455 01/26/17 0427  NA 141 137 139  K 4.8 4.9 4.8  CL 102 103 100*  CO2 BUN 118* 116* 120*  CREATININE 2.94* 2.95* 3.01*  CALCIUM 9.7 8.9 9.0  GLUCOSE 122* 134* 125*    Labs: BUN 120 and Cr 3.01  Diet Order:  Diet  Heart Room service appropriate? Yes; Fluid consistency: Thin; Fluid restriction: 1500 mL Fluid  Skin:   stage I to sacrum, stage II left buttock  Last BM:  10/14   Height:   Ht Readings from Last 1 Encounters:  01/24/17  (1.702 m)    Weight:   Wt Readings from Last 1 Encounters:  01/26/17 136 lb 14.5 oz (62.1 kg)    Ideal Body Weight:  67 kg  BMI:  Body mass index is 21.44 kg/m.  Estimated Nutritional Needs:   Kcal:  1950-2106  Protein:  60-65 gr  Fluid:  1500 ml daily  EDUCATION NEEDS:   Education needs addressed (Fluid restriction (6 cups) daily. ) the patient says complying to the fluid goal has not been difficult for him.  Royann Shivers MS,RD,CSG,LDN Office: [phone] Pager: [phone]

## 2017-01-27 ENCOUNTER — Other Ambulatory Visit (HOSPITAL_COMMUNITY): Payer: Medicare Other

## 2017-01-27 DIAGNOSIS — E43 Unspecified severe protein-calorie malnutrition: Secondary | ICD-10-CM | POA: Insufficient documentation

## 2017-01-27 LAB — BASIC METABOLIC PANEL
ANION GAP: 12 (ref 5–15)
BUN: 126 mg/dL — ABNORMAL HIGH (ref 6–20)
CALCIUM: 8.9 mg/dL (ref 8.9–10.3)
CHLORIDE: 100 mmol/L — AB (ref 101–111)
CO2: 27 mmol/L (ref 22–32)
Creatinine, Ser: 2.69 mg/dL — ABNORMAL HIGH (ref 0.61–1.24)
GFR calc non Af Amer: 19 mL/min — ABNORMAL LOW (ref >60)
GFR, EST AFRICAN AMERICAN: 22 mL/min — AB (ref >60)
Glucose, Bld: 123 mg/dL — ABNORMAL HIGH (ref 65–99)
Potassium: 4.1 mmol/L (ref 3.5–5.1)
Sodium: 139 mmol/L (ref 135–145)

## 2017-01-27 NOTE — Progress Notes (Signed)
RN adv pt of Morphine order for dyspnea; pt stated he did not feel at the time that he needed any morphine and would let RN know when he did.

## 2017-01-27 NOTE — Plan of Care (Signed)
Problem: Safety: Goal: Ability to remain free from injury will improve Outcome: Adequate for Discharge Pt is a high fall risk d/t multiple falls before admission. RN assisted pt to chair with 1 assist; pt steady on feet. Pt educated on safety/fall precautions. Pt verbalized understanding. Will continue to monitor pt

## 2017-01-27 NOTE — Progress Notes (Signed)
Patient resting comfortably less dyspneic less orthopneic taking morphine sulfate nasal O2 and Lasix 40 IV twice a day creatinine improved to 2.67 today down from 3.0 Tarry KosGordon W Guerrera [id number] DOB: 02/07/1923 DOA: 01/24/2017 PCP: Oval Linseyondiego, Naeem Quillin, MD   Physical Exam: Blood pressure 108/71, pulse 80, temperature 98.1 F (36.7 C), temperature source Oral, resp. rate 16, height 5\' 7"  (1.702 m), weight 59.3 kg (130 lb 11.7 oz), SpO2 93 %. Lungs decreased breath sounds in the bases no rales audible heart regular rhythm no S3-S4 no heaves thrills rubs 2/6 aortic flow murmur   Investigations:  No results found for this or any previous visit (from the past 240 hour(s)).   Basic Metabolic Panel:  Recent Labs  81/19/1410/16/18 0427 01/27/17 0625  NA 139 139  K 4.8 4.1  CL 100* 100*  CO2 26 27  GLUCOSE 125* 123*  BUN 120* 126*  CREATININE 3.01* 2.69*  CALCIUM 9.0 8.9   Liver Function Tests:  Recent Labs  01/24/17 2115  AST 29  ALT 22  ALKPHOS 88  BILITOT 1.4*  PROT 8.7*  ALBUMIN 3.5     CBC:  Recent Labs  01/24/17 2020  WBC 13.4*  NEUTROABS 11.0*  HGB 15.1  HCT 44.4  MCV 96.3  PLT 124*    No results found.    Medications:   Impression:  Principal Problem:   Acute on chronic combined systolic and diastolic CHF (congestive heart failure) (HCC) Active Problems:   Essential hypertension   Acute renal failure superimposed on stage 4 chronic kidney disease (HCC)   Elevated troponin   Protein-calorie malnutrition, severe     Plan: Continue Lasix 40 IV every 12 hours continue morphine sulfate 1 mg IV every 2 hours when necessary. Monitor renal function daily. Physical therapy for strengthening and ambulation  Consultants:    Procedures   Antibiotics:            Time spent: 30 minutes   LOS: 3 days   Sewell Pitner M   01/27/2017, 12:30 PM

## 2017-01-28 LAB — BASIC METABOLIC PANEL
Anion gap: 12 (ref 5–15)
BUN: 122 mg/dL — ABNORMAL HIGH (ref 6–20)
CHLORIDE: 98 mmol/L — AB (ref 101–111)
CO2: 29 mmol/L (ref 22–32)
CREATININE: 2.39 mg/dL — AB (ref 0.61–1.24)
Calcium: 9 mg/dL (ref 8.9–10.3)
GFR calc non Af Amer: 22 mL/min — ABNORMAL LOW (ref >60)
GFR, EST AFRICAN AMERICAN: 26 mL/min — AB (ref >60)
Glucose, Bld: 116 mg/dL — ABNORMAL HIGH (ref 65–99)
Potassium: 5.2 mmol/L — ABNORMAL HIGH (ref 3.5–5.1)
Sodium: 139 mmol/L (ref 135–145)

## 2017-01-28 NOTE — Progress Notes (Signed)
  Danny [name] [id number] DOB: 06/13/1922 DOA: 01/24/2017 PCP: Oval Linseyondiego, Danny Pounds, MD   Physical Exam: Blood pressure (!) 127/53, pulse 62, temperature 98.2 F (36.8 C), resp. rate 18, height 5\' 7"  (1.702 m), weight 59.1 kg (130 lb 4.7 oz), SpO2 92 %. Lungs diminished breath sounds in bases no rales wheeze rhonchi appreciable heart regular rhythm 2/6 aortic flow murmur no S3 auscultated no heaves thrills rubs   Investigations:  No results found for this or any previous visit (from the past 240 hour(s)).   Basic Metabolic Panel:  Recent Labs  81/19/1410/17/18 0625 01/28/17 0511  NA 139 139  K 4.1 5.2*  CL 100* 98*  CO2 27 29  GLUCOSE 123* 116*  BUN 126* 122*  CREATININE 2.69* 2.39*  CALCIUM 8.9 9.0   Liver Function Tests: No results for input(s): AST, ALT, ALKPHOS, BILITOT, PROT, ALBUMIN in the last 72 hours.   CBC: No results for input(s): WBC, NEUTROABS, HGB, HCT, MCV, PLT in the last 72 hours.  No results found.    Medications:  Impression:  Principal Problem:   Acute on chronic combined systolic and diastolic CHF (congestive heart failure) (HCC) Active Problems:   Essential hypertension   Acute renal failure superimposed on stage 4 chronic kidney disease (HCC)   Elevated troponin   Protein-calorie malnutrition, severe     Plan: 4-point walker cardiology and physical therapy for strengthening ambulation continue diuresis monitor renal function  Consultants: Physical therapy   Procedures   Antibiotics:         Time spent: 30 minutes   LOS: 4 days   Danny [name] M   01/28/2017, 1:04 PM

## 2017-01-28 NOTE — Evaluation (Signed)
Physical Therapy Evaluation Patient Details Name: Danny [name] MRN: [id number] DOB: 02/13/1923 Today's Date: 01/28/2017   History of Present Illness  Danny [name] is a 83 y.o. male with medical history significant for hypertension, heart block status post pacer placement, and chronic combined systolic/diastolic CHF, now presenting to the emergency department with progressive dyspnea. Patient had been admitted to the hospital just over a week ago with similar complaints and was found to be and acute CHF at that time. He was diuresed and discharged in much improved and stable condition, did well initially back at home, but now has recurrence in his dyspnea over the past 2-3 days. No chest pain, no fevers, no chills.denies missing his medications or dietary indiscretions.    Clinical Impression  Patient limited for functional mobility and gait as stated below secondary to BLE weakness, fatigue and poor standing balance.  Patient will benefit from continued physical therapy in hospital and recommended venue below to increase strength, balance, endurance for safe ADLs and gait.    Follow Up Recommendations Home health PT;Supervision/Assistance - 24 hour    Equipment Recommendations       Recommendations for Other Services       Precautions / Restrictions Precautions Precautions: Fall Restrictions Weight Bearing Restrictions: No      Mobility  Bed Mobility Overal bed mobility: Needs Assistance Bed Mobility: Supine to Sit;Sit to Supine     Supine to sit: Min assist Sit to supine: Supervision      Transfers Overall transfer level: Needs assistance Equipment used: Rolling walker (2 wheeled) Transfers: Sit to/from [employer] CorporationStand;Stand Pivot Transfers Sit to Stand: Min assist Stand pivot transfers: Min assist          Ambulation/Gait Ambulation/Gait assistance: Min assist Ambulation Distance (Feet): 30 Feet Assistive device: Rolling walker (2 wheeled) Gait  Pattern/deviations: Decreased step length - right;Decreased step length - left;Decreased stride length Gait velocity: slow Gait velocity interpretation: Below normal speed for age/gender General Gait Details: demonstrates slow labored cadence, slightly unsteady without loss of balance using RW, when taking steps without assistive device, very unsteady with near falls  Stairs            Wheelchair Mobility    Modified Rankin (Stroke Patients Only)       Balance Overall balance assessment: Needs assistance Sitting-balance support: No upper extremity supported;Feet supported Sitting balance-Leahy Scale: Good     Standing balance support: Bilateral upper extremity supported;During functional activity Standing balance-Leahy Scale: Fair Standing balance comment: standing balance poor without assistive device                             Pertinent Vitals/Pain Pain Assessment: No/denies pain    Home Living Family/patient expects to be discharged to:: Private residence Living Arrangements: Alone Available Help at Discharge: Family (lives next door) Type of Home: House Home Access: Stairs to enter Entrance Stairs-Rails: None Entrance Stairs-Number of Steps: 1 Home Layout: One level Home Equipment: Cane - single point;Walker - 2 wheels;Wheelchair - manual;Bedside commode      Prior Function Level of Independence: Independent               Hand Dominance        Extremity/Trunk Assessment   Upper Extremity Assessment Upper Extremity Assessment: Generalized weakness    Lower Extremity Assessment Lower Extremity Assessment: Generalized weakness    Cervical / Trunk Assessment Cervical / Trunk Assessment: Kyphotic  Communication   Communication: No difficulties  Cognition Arousal/Alertness: Awake/alert Behavior During Therapy: WFL for tasks assessed/performed Overall Cognitive Status: Within Functional Limits for tasks assessed                                         General Comments      Exercises     Assessment/Plan    PT Assessment Patient needs continued PT services  PT Problem List Decreased strength;Decreased activity tolerance;Decreased balance;Decreased mobility       PT Treatment Interventions Gait training;Functional mobility training;Therapeutic activities;Therapeutic exercise;Patient/family education    PT Goals (Current goals can be found in the Care Plan section)  Acute Rehab PT Goals Patient Stated Goal: return home with family to assist PT Goal Formulation: With patient/family Time For Goal Achievement: 02-25-17 Potential to Achieve Goals: Good    Frequency Min 3X/week   Barriers to discharge        Co-evaluation               AM-PAC PT "6 Clicks" Daily Activity  Outcome Measure Difficulty turning over in bed (including adjusting bedclothes, sheets and blankets)?: A Little Difficulty moving from lying on back to sitting on the side of the bed? : A Little Difficulty sitting down on and standing up from a chair with arms (e.g., wheelchair, bedside commode, etc,.)?: A Little Help needed moving to and from a bed to chair (including a wheelchair)?: A Little Help needed walking in hospital room?: A Little Help needed climbing 3-5 steps with a railing? : A Lot 6 Click Score: 17    End of Session Equipment Utilized During Treatment: Gait belt Activity Tolerance: Patient limited by fatigue Patient left: in chair;with call bell/phone within reach;with family/visitor present Nurse Communication: Mobility status PT Visit Diagnosis: Unsteadiness on feet (R26.81);Other abnormalities of gait and mobility (R26.89);Muscle weakness (generalized) (M62.81)    Time: 1610-9604 PT Time Calculation (min) (ACUTE ONLY): 27 min   Charges:   PT Evaluation $PT Eval Low Complexity: 1 Low PT Treatments $Therapeutic Activity: 23-37 mins   PT G Codes:        3:54 PM, 02-25-2017 Ocie Bob,  MPT Physical Therapist with [facility] [phone] office [phone] mobile phone

## 2017-01-29 LAB — BASIC METABOLIC PANEL
Anion gap: 10 (ref 5–15)
BUN: 124 mg/dL — ABNORMAL HIGH (ref 6–20)
CHLORIDE: 98 mmol/L — AB (ref 101–111)
CO2: 33 mmol/L — ABNORMAL HIGH (ref 22–32)
CREATININE: 2.2 mg/dL — AB (ref 0.61–1.24)
Calcium: 9 mg/dL (ref 8.9–10.3)
GFR calc non Af Amer: 25 mL/min — ABNORMAL LOW (ref >60)
GFR, EST AFRICAN AMERICAN: 28 mL/min — AB (ref >60)
Glucose, Bld: 135 mg/dL — ABNORMAL HIGH (ref 65–99)
POTASSIUM: 4.4 mmol/L (ref 3.5–5.1)
SODIUM: 141 mmol/L (ref 135–145)

## 2017-01-29 MED ORDER — FUROSEMIDE 20 MG PO TABS: 20.0000 mg | ORAL_TABLET | Freq: Three times a day (TID) | ORAL | Status: DC

## 2017-01-29 MED ORDER — FUROSEMIDE 20 MG PO TABS: 20.0000 mg | ORAL_TABLET | Freq: Three times a day (TID) | ORAL | 1 refills | Status: AC

## 2017-01-29 MED ORDER — IPRATROPIUM-ALBUTEROL 0.5-2.5 (3) MG/3ML IN SOLN: 3.0000 mL | Freq: Four times a day (QID) | RESPIRATORY_TRACT | 2 refills | Status: AC | PRN

## 2017-01-29 NOTE — Discharge Summary (Signed)
Physician Discharge Summary  Tarry KosGordon W Keimig [id number] DOB: 08/15/1922 DOA: 01/24/2017  PCP: Oval Linseyondiego, Lindzee Gouge, MD  Admit date: 01/24/2017 Discharge date: 01/29/2017   Recommendations for Outpatient Follow-up:  Patient and family are instructed to take Lasix 20 mg by mouth 3 times daily to use DuoNeb nebulizer solution 0.5-2.5 mg 4 times a day when necessary for dyspnea and ambulate with assistance 04 point walker at home as well as to follow-up my office in 4 days time also cautioned about dietary indiscretion and high sodium Discharge Diagnoses:  Principal Problem:   Acute on chronic combined systolic and diastolic CHF (congestive heart failure) (HCC) Active Problems:   Essential hypertension   Acute renal failure superimposed on stage 4 chronic kidney disease (HCC)   Elevated troponin   Protein-calorie malnutrition, severe   Discharge Condition: Good  Filed Weights   01/27/17 0500 01/28/17 0500 01/29/17 0545  Weight: 59.3 kg (130 lb 11.7 oz) 59.1 kg (130 lb 4.7 oz) 58.9 kg (129 lb 13.6 oz)    History of present illness:  Patient a 83 year old I h severe aortic stenosis and significant left ventricular systolic dysfunction EF 35% was admitted twice in the last 3 weeks with recurrent acute on chronic systolic and diastolic heart failure she was home on lisinopril 20 and Lasix 20 twice a day had increasing dyspnea can min with orthopnea PND chest x-ray consistent with CHF and an elevated BNP he was diuresed over 4 days time with Lasix 40 mg IV every 12 hours low tense and was discontinued his renal function improved creatinine came down to 2.6 it was felt that ACE inhibitor had an adverse effect upon renal perfusion therefore was discontinued his blood pressures remained around 100 systolic while in hospital he had the addition of physical therapy as well as DuoNeb nebulizer and morphine sulfate for dyspnea patient and family were agree that he is a no code and there is no  surgery planned point this aortic stenosis per patient's wishes. He had physical therapy while in hospital and will go home with a 4-point walker with a seat and wheels  Hospital Course:  See history of present illness above  Procedures:     Consultations:  Physical therapy  Discharge Instructions  Discharge Instructions    Discharge instructions    Complete by:  As directed    Discharge patient    Complete by:  As directed    Discharge disposition:  01-Home or Self Care   Discharge patient date:  01/29/2017     Allergies as of 01/29/2017      Reactions   Other Other (See Comments)   SKIN IS VERY THIN; TAPE WILL TEAR THE SKIN AND BRUISE IT!!   Tape Other (See Comments)   SKIN IS VERY THIN; TAPE WILL TEAR THE SKIN AND BRUISE IT!!      Medication List    STOP taking these medications   benazepril 20 MG tablet Commonly known as:  LOTENSIN     TAKE these medications   furosemide 20 MG tablet Commonly known as:  LASIX Take 1 tablet (20 mg total) by mouth 3 (three) times daily. What changed:  when to take this   ipratropium-albuterol 0.5-2.5 (3) MG/3ML Soln Commonly known as:  DUONEB Take 3 mLs by nebulization every 6 (six) hours as needed.            Durable Medical Equipment        Start     Ordered   01/28/17  1315  For home use only DME Walker rolling  Once    Question:  Patient needs a walker to treat with the following condition  Answer:  Gait instability   01/28/17 1315     Allergies  Allergen Reactions  . Other Other (See Comments)    SKIN IS VERY THIN; TAPE WILL TEAR THE SKIN AND BRUISE IT!!  . Tape Other (See Comments)    SKIN IS VERY THIN; TAPE WILL TEAR THE SKIN AND BRUISE IT!!      The results of significant diagnostics from this hospitalization (including imaging, microbiology, ancillary and laboratory) are listed below for reference.    Significant Diagnostic Studies: Dg Chest 2 View  Result Date: 01/12/2017 CLINICAL DATA:   Shortness of breath and weakness. EXAM: CHEST  2 VIEW COMPARISON:  12/12/2014 FINDINGS: There is a left chest wall pacer device with lead in the right atrial appendage right ventricle peer stable cardiac enlargement. Moderate to large left pleural effusion and small right effusion. Diffuse pulmonary vascular congestion noted. IMPRESSION: 1. Suspect congestive heart failure. Electronically Signed   By: Signa Kell M.D.   On: 01/12/2017 13:21   Dg Chest Portable 1 View  Result Date: 01/24/2017 CLINICAL DATA:  Worsening dyspnea over the past 2 days. EXAM: PORTABLE CHEST 1 VIEW COMPARISON:  01/12/2017 FINDINGS: Moderately large left pleural effusion, probably unchanged. Small right pleural effusion, enlarged. Marked cardiomegaly, unchanged. Mild vascular and interstitial prominence, slightly worsened. IMPRESSION: The findings likely represent congestive heart failure with interstitial edema and pleural effusions. This is mildly worsened from 01/12/2017. Electronically Signed   By: Ellery Plunk M.D.   On: 01/24/2017 20:36    Microbiology: No results found for this or any previous visit (from the past 240 hour(s)).   Labs: Basic Metabolic Panel:  Recent Labs Lab 01/25/17 0455 01/26/17 0427 01/27/17 0625 01/28/17 0511 01/29/17 0659  NA 137 139 139 139 141  K 4.9 4.8 4.1 5.2* 4.4  CL 103 100* 100* 98* 98*  CO2 23 26 27 29  33*  GLUCOSE 134* 125* 123* 116* 135*  BUN 116* 120* 126* 122* 124*  CREATININE 2.95* 3.01* 2.69* 2.39* 2.20*  CALCIUM 8.9 9.0 8.9 9.0 9.0   Liver Function Tests:  Recent Labs Lab 01/24/17 2115  AST 29  ALT 22  ALKPHOS 88  BILITOT 1.4*  PROT 8.7*  ALBUMIN 3.5   No results for input(s): LIPASE, AMYLASE in the last 168 hours. No results for input(s): AMMONIA in the last 168 hours. CBC:  Recent Labs Lab 01/24/17 2020  WBC 13.4*  NEUTROABS 11.0*  HGB 15.1  HCT 44.4  MCV 96.3  PLT 124*   Cardiac Enzymes:  Recent Labs Lab 01/24/17 2115  01/25/17 0455 01/25/17 1133  TROPONINI 0.07* 0.06* 0.06*   BNP: BNP (last 3 results)  Recent Labs  01/24/17 2020  BNP >4,500.0*    ProBNP (last 3 results) No results for input(s): PROBNP in the last 8760 hours.  CBG: No results for input(s): GLUCAP in the last 168 hours.     Signed:  Isabella Stalling   Pager: [phone] 01/29/2017, 12:26 PM

## 2017-01-29 NOTE — Care Management (Signed)
Patient discharging home today with [facility] family choice. RW delivered to room. No other CM needs.

## 2017-01-29 NOTE — Care Management Important Message (Signed)
Important Message  Patient Details  Name: Danny [name] MRN: [id number] Date of Birth: 06/02/1922   Medicare Important Message Given:  Yes    Melayah Skorupski, Chrystine OilerSharley Diane, RN 01/29/2017, 1:08 PM

## 2017-01-29 NOTE — Progress Notes (Signed)
Pt IV removed, WNL. D/C instructions given to pt. Verbalized understanding. Pt family at bedside to transport home.  

## 2017-02-09 ENCOUNTER — Other Ambulatory Visit: Payer: Self-pay | Admitting: Internal Medicine

## 2017-02-11 DEATH — deceased

## 2017-02-12 ENCOUNTER — Encounter: Payer: Self-pay | Admitting: Internal Medicine

## 2017-02-17 ENCOUNTER — Other Ambulatory Visit: Payer: Self-pay | Admitting: Internal Medicine

## 2017-02-19 ENCOUNTER — Encounter: Payer: Medicare Other | Admitting: Internal Medicine

## 2019-05-15 IMAGING — CR DG CHEST 1V PORT
1 series · 1 of 1 positions shown · non-contrast
Comparison: 01/12/2017

CLINICAL DATA: Worsening dyspnea over the past 2 days.

EXAM:
PORTABLE CHEST 1 VIEW

[portable]
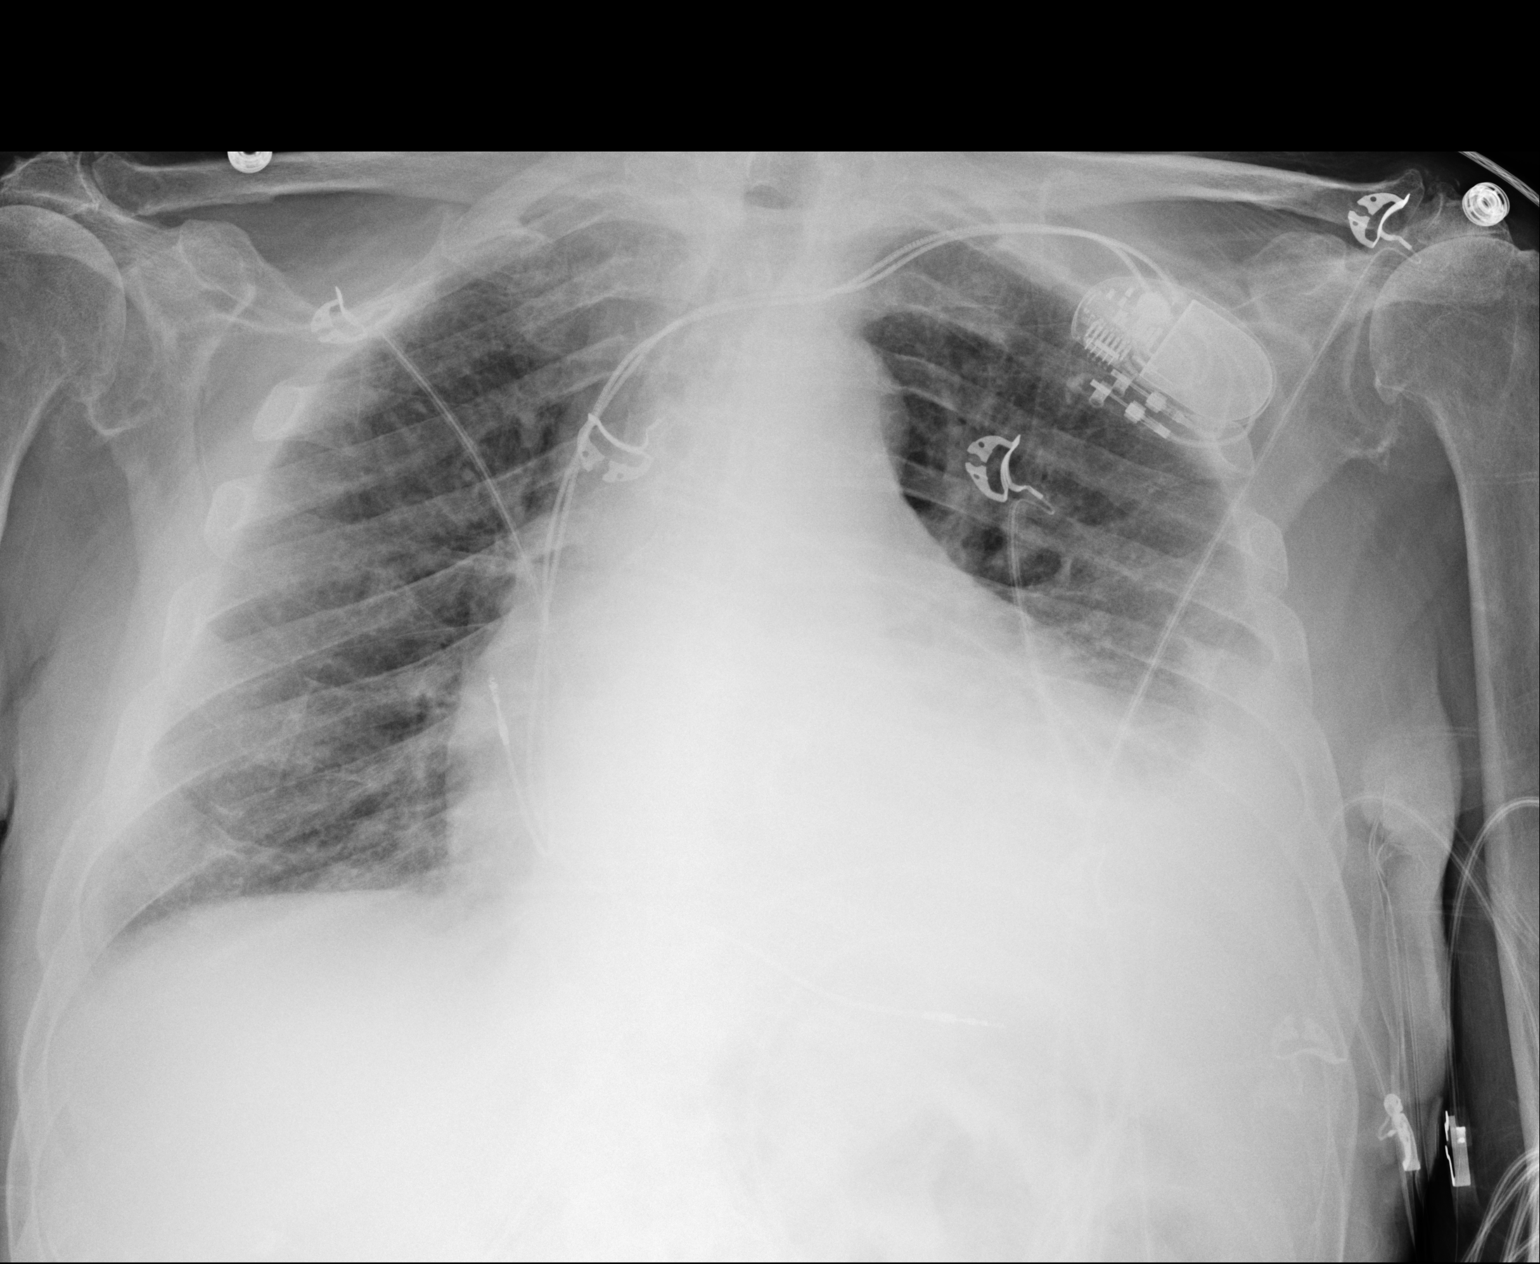

[1 of 1 positions shown; findings below may reference images not displayed]

FINDINGS: Moderately large left pleural effusion, probably unchanged. Small
right pleural effusion, enlarged. Marked cardiomegaly, unchanged.
Mild vascular and interstitial prominence, slightly worsened.
IMPRESSION: The findings likely represent congestive heart failure with
interstitial edema and pleural effusions. This is mildly worsened
from 01/12/2017.
